# Patient Record
Sex: Female | Born: 1935 | Smoking: Never smoker
Health system: Southern US, Community
[De-identification: ages and names within clinical notes are randomized; demographics above are authoritative.]

---

## 1997-09-12 ENCOUNTER — Other Ambulatory Visit: Admission: RE | Admit: 1997-09-12 | Discharge: 1997-09-12 | Payer: Self-pay | Admitting: Gynecology

## 1998-10-14 ENCOUNTER — Other Ambulatory Visit: Admission: RE | Admit: 1998-10-14 | Discharge: 1998-10-14 | Payer: Self-pay | Admitting: Gynecology

## 1999-09-02 ENCOUNTER — Other Ambulatory Visit: Admission: RE | Admit: 1999-09-02 | Discharge: 1999-09-02 | Payer: Self-pay | Admitting: Gynecology

## 2000-11-09 ENCOUNTER — Other Ambulatory Visit: Admission: RE | Admit: 2000-11-09 | Discharge: 2000-11-09 | Payer: Self-pay | Admitting: Gynecology

## 2001-12-06 ENCOUNTER — Other Ambulatory Visit: Admission: RE | Admit: 2001-12-06 | Discharge: 2001-12-06 | Payer: Self-pay | Admitting: Gynecology

## 2002-01-24 ENCOUNTER — Ambulatory Visit (HOSPITAL_COMMUNITY): Admission: RE | Admit: 2002-01-24 | Discharge: 2002-01-24 | Payer: Self-pay | Admitting: Geriatric Medicine

## 2003-01-09 ENCOUNTER — Other Ambulatory Visit: Admission: RE | Admit: 2003-01-09 | Discharge: 2003-01-09 | Payer: Self-pay | Admitting: Gynecology

## 2004-01-21 ENCOUNTER — Other Ambulatory Visit: Admission: RE | Admit: 2004-01-21 | Discharge: 2004-01-21 | Payer: Self-pay | Admitting: Gynecology

## 2004-08-29 ENCOUNTER — Ambulatory Visit (HOSPITAL_COMMUNITY): Admission: RE | Admit: 2004-08-29 | Discharge: 2004-08-30 | Payer: Self-pay | Admitting: Ophthalmology

## 2005-01-22 ENCOUNTER — Other Ambulatory Visit: Admission: RE | Admit: 2005-01-22 | Discharge: 2005-01-22 | Payer: Self-pay | Admitting: Gynecology

## 2007-07-28 ENCOUNTER — Ambulatory Visit: Payer: Self-pay | Admitting: Oncology

## 2007-08-09 LAB — CBC WITH DIFFERENTIAL/PLATELET
HCT: 39.5 % (ref 34.8–46.6)
LYMPH%: 45.4 % (ref 14.0–48.0)
MCH: 27.3 pg (ref 26.0–34.0)
MCHC: 32.2 g/dL (ref 32.0–36.0)
MCV: 84.7 fL (ref 81.0–101.0)
MONO#: 0.4 10*3/uL (ref 0.1–0.9)
MONO%: 10.9 % (ref 0.0–13.0)
NEUT#: 1.5 10*3/uL (ref 1.5–6.5)
RBC: 4.66 10*6/uL (ref 3.70–5.32)
lymph#: 1.8 10*3/uL (ref 0.9–3.3)

## 2007-08-11 LAB — COMPREHENSIVE METABOLIC PANEL
Alkaline Phosphatase: 68 U/L (ref 39–117)
BUN: 11 mg/dL (ref 6–23)
CO2: 28 mEq/L (ref 19–32)
Chloride: 103 mEq/L (ref 96–112)
Creatinine, Ser: 1.03 mg/dL (ref 0.40–1.20)
Glucose, Bld: 86 mg/dL (ref 70–99)
Potassium: 3.9 mEq/L (ref 3.5–5.3)
Sodium: 143 mEq/L (ref 135–145)
Total Bilirubin: 0.5 mg/dL (ref 0.3–1.2)

## 2007-08-11 LAB — SPEP & IFE WITH QIG
Albumin ELP: 61.1 % (ref 55.8–66.1)
Alpha-1-Globulin: 3.8 % (ref 2.9–4.9)
Alpha-2-Globulin: 10.8 % (ref 7.1–11.8)
IgM, Serum: 94 mg/dL (ref 60–263)

## 2007-11-07 ENCOUNTER — Ambulatory Visit: Payer: Self-pay | Admitting: Oncology

## 2007-11-09 LAB — CBC WITH DIFFERENTIAL/PLATELET
BASO%: 0.1 % (ref 0.0–2.0)
Basophils Absolute: 0 10*3/uL (ref 0.0–0.1)
HCT: 36.6 % (ref 34.8–46.6)
HGB: 12.2 g/dL (ref 11.6–15.9)
MCV: 84 fL (ref 81.0–101.0)
MONO#: 0.6 10*3/uL (ref 0.1–0.9)
NEUT%: 53.1 % (ref 39.6–76.8)
RDW: 14.7 % — ABNORMAL HIGH (ref 11.3–14.5)
WBC: 4.8 10*3/uL (ref 3.9–10.0)

## 2008-05-03 ENCOUNTER — Ambulatory Visit: Payer: Self-pay | Admitting: Oncology

## 2008-05-08 LAB — CBC WITH DIFFERENTIAL/PLATELET
BASO%: 0.2 % (ref 0.0–2.0)
Basophils Absolute: 0 10*3/uL (ref 0.0–0.1)
EOS%: 2.9 % (ref 0.0–7.0)
HCT: 35.5 % (ref 34.8–46.6)
LYMPH%: 36.3 % (ref 14.0–49.7)
MCH: 27.1 pg (ref 25.1–34.0)
MCHC: 33.5 g/dL (ref 31.5–36.0)
MCV: 80.9 fL (ref 79.5–101.0)
MONO#: 0.6 10*3/uL (ref 0.1–0.9)
Platelets: 98 10*3/uL — ABNORMAL LOW (ref 145–400)
RBC: 4.39 10*6/uL (ref 3.70–5.45)
RDW: 14.7 % — ABNORMAL HIGH (ref 11.2–14.5)
lymph#: 1.6 10*3/uL (ref 0.9–3.3)
nRBC: 0 % (ref 0–0)

## 2008-05-11 ENCOUNTER — Ambulatory Visit (HOSPITAL_COMMUNITY): Admission: RE | Admit: 2008-05-11 | Discharge: 2008-05-11 | Payer: Self-pay | Admitting: Oncology

## 2008-10-05 ENCOUNTER — Ambulatory Visit: Payer: Self-pay | Admitting: Oncology

## 2008-10-10 LAB — CBC WITH DIFFERENTIAL/PLATELET
BASO%: 0.3 % (ref 0.0–2.0)
Basophils Absolute: 0 10*3/uL (ref 0.0–0.1)
HCT: 36.9 % (ref 34.8–46.6)
LYMPH%: 40.7 % (ref 14.0–49.7)
MCH: 27.9 pg (ref 25.1–34.0)
MCHC: 33.2 g/dL (ref 31.5–36.0)
MCV: 84 fL (ref 79.5–101.0)
MONO#: 0.4 10*3/uL (ref 0.1–0.9)
NEUT#: 1.6 10*3/uL (ref 1.5–6.5)
NEUT%: 45.6 % (ref 38.4–76.8)
Platelets: 114 10*3/uL — ABNORMAL LOW (ref 145–400)
RBC: 4.39 10*6/uL (ref 3.70–5.45)
RDW: 15.1 % — ABNORMAL HIGH (ref 11.2–14.5)
WBC: 3.5 10*3/uL — ABNORMAL LOW (ref 3.9–10.3)
lymph#: 1.4 10*3/uL (ref 0.9–3.3)

## 2008-10-10 LAB — COMPREHENSIVE METABOLIC PANEL
ALT: 15 U/L (ref 0–35)
Chloride: 104 mEq/L (ref 96–112)
Sodium: 143 mEq/L (ref 135–145)
Total Protein: 6.5 g/dL (ref 6.0–8.3)

## 2010-06-06 ENCOUNTER — Inpatient Hospital Stay (INDEPENDENT_AMBULATORY_CARE_PROVIDER_SITE_OTHER)
Admission: RE | Admit: 2010-06-06 | Discharge: 2010-06-06 | Disposition: A | Discharge status: Home / Self Care | Payer: Self-pay | Source: Ambulatory Visit | Attending: Emergency Medicine | Admitting: Emergency Medicine

## 2010-06-06 DIAGNOSIS — Z043 Encounter for examination and observation following other accident: Secondary | ICD-10-CM

## 2010-06-20 NOTE — Op Note (Signed)
NAME:  Kristina Brooks, Kristina Brooks                 ACCOUNT NO.:  [id number]   MEDICAL RECORD NO.:  [id number]          PATIENT TYPE:  OIB   LOCATION:  5735                         FACILITY:  [facility]   PHYSICIAN:  Alford Highland. Rankin, M.D.   DATE OF BIRTH:  1934-06-23   DATE OF PROCEDURE:  08/29/2004  DATE OF DISCHARGE:                                 OPERATIVE REPORT   PREOPERATIVE DIAGNOSIS:  Rhegmatogenous detachment - left eye - temporally.   POSTOPERATIVE DIAGNOSIS:  Rhegmatogenous detachment - left eye - temporally.   PROCEDURES:  1.  Scleral buckle using 287 solid silicone explant temporally and      inferotemporally.  2.  Retinal cryopexy left eye to repair retinal detachment.   SURGEON:  Fawn Kirk, M.D.   ANESTHESIA:  Local retrobulbar with monitored anesthesia control.   INDICATION FOR PROCEDURE:  The patient is a 76 year old woman who has  subclinical rhegmatogenous detachment with threatened extension.  She  understands this is an attempt to repair the retinal detachment as well as  to prevent progression of retinal detachment to involve central visual  acuity.  She understands the risks of anesthesia including rare occurrence  of death, problems to the eye from the procedure and the surgical repair  including, but not limited to, hemorrhage, infection, scarring, need for  surgery, no change in vision, loss of vision, progressive disease despite  intervention.  Appropriate signed consent was obtained.  The patient was  taken to the operating room.  In the operating room appropriate monitors  followed by mild sedation.  Marcaine 0.75% was delivered in 5 mL followed by  an additional 5 mL laterally in the fashion of a modified [employer]  The  left periocular region was sterilely prepped and draped in the usual  ophthalmic fashion, with a lid speculum applied.  Peritomy was then  fashioned for the 2:30 to the 6:30 position.  Intermuscular septum was  entered superotemporal, inferotemporal  and inferonasal quadrants.  The  inferior and lateral rectus muscles were isolated on 2-0 silk ties.  Retinal  cryopexy was placed in the bed of the retinal detachment on the  inferotemporal quadrant.  A retinal tear was discovered and was well treated  at the 5 o'clock position.   At this time, a 287 solid silicone explant was selected and tied temporarily  inferotemporal quadrant.  The ends were placed into each of the rectus  muscles mentioned above.  A 30 gauge needle was then used to perform  paracentesis so as to soften the globe.  Buccal was then permanently tied  with extra height.   At this time, an indirect ophthalmoscopy was then performed to confirm that  buccal had supported the bed of the detachment nicely.  There was  essentially no subretinal fluid left.  At this time, conjunctiva then tenons  were brought forward and closed with interrupted 7-0 Vicryl suture.  The bed  of the buckle had been irrigated with bug juice.  Subconjunctival injections  of antibiotics were applied.   At this time, sterile patch and Fox shield applied left eye.  Patient  tolerated the procedure without complication.  She was taken to the PACU in  good and stable condition.      Alford Highland Rankin, M.D.  Electronically Signed     GAR/MEDQ  D:  08/29/2004  T:  08/29/2004  Job:  [id number]

## 2014-04-06 DIAGNOSIS — H25013 Cortical age-related cataract, bilateral: Secondary | ICD-10-CM | POA: Diagnosis not present

## 2014-04-06 DIAGNOSIS — H33199 Other retinoschisis and retinal cysts, unspecified eye: Secondary | ICD-10-CM | POA: Diagnosis not present

## 2014-04-06 DIAGNOSIS — H2513 Age-related nuclear cataract, bilateral: Secondary | ICD-10-CM | POA: Diagnosis not present

## 2014-04-06 DIAGNOSIS — H35361 Drusen (degenerative) of macula, right eye: Secondary | ICD-10-CM | POA: Diagnosis not present

## 2014-07-24 DIAGNOSIS — Z1231 Encounter for screening mammogram for malignant neoplasm of breast: Secondary | ICD-10-CM | POA: Diagnosis not present

## 2014-08-15 DIAGNOSIS — L932 Other local lupus erythematosus: Secondary | ICD-10-CM | POA: Diagnosis not present

## 2014-08-15 DIAGNOSIS — R5383 Other fatigue: Secondary | ICD-10-CM | POA: Diagnosis not present

## 2014-08-15 DIAGNOSIS — R7989 Other specified abnormal findings of blood chemistry: Secondary | ICD-10-CM | POA: Diagnosis not present

## 2014-08-15 DIAGNOSIS — E784 Other hyperlipidemia: Secondary | ICD-10-CM | POA: Diagnosis not present

## 2014-08-15 DIAGNOSIS — E559 Vitamin D deficiency, unspecified: Secondary | ICD-10-CM | POA: Diagnosis not present

## 2014-08-19 DIAGNOSIS — Z9889 Other specified postprocedural states: Secondary | ICD-10-CM | POA: Diagnosis not present

## 2014-08-19 DIAGNOSIS — Z6821 Body mass index (BMI) 21.0-21.9, adult: Secondary | ICD-10-CM | POA: Diagnosis not present

## 2014-08-19 DIAGNOSIS — Z8249 Family history of ischemic heart disease and other diseases of the circulatory system: Secondary | ICD-10-CM | POA: Diagnosis not present

## 2014-08-19 DIAGNOSIS — H269 Unspecified cataract: Secondary | ICD-10-CM | POA: Diagnosis not present

## 2014-08-19 DIAGNOSIS — D649 Anemia, unspecified: Secondary | ICD-10-CM | POA: Diagnosis not present

## 2014-08-19 DIAGNOSIS — E785 Hyperlipidemia, unspecified: Secondary | ICD-10-CM | POA: Diagnosis not present

## 2014-08-19 DIAGNOSIS — J302 Other seasonal allergic rhinitis: Secondary | ICD-10-CM | POA: Diagnosis not present

## 2014-08-28 DIAGNOSIS — L932 Other local lupus erythematosus: Secondary | ICD-10-CM | POA: Diagnosis not present

## 2014-10-11 DIAGNOSIS — J301 Allergic rhinitis due to pollen: Secondary | ICD-10-CM | POA: Diagnosis not present

## 2014-10-11 DIAGNOSIS — R21 Rash and other nonspecific skin eruption: Secondary | ICD-10-CM | POA: Diagnosis not present

## 2014-10-11 DIAGNOSIS — J3089 Other allergic rhinitis: Secondary | ICD-10-CM | POA: Diagnosis not present

## 2014-10-11 DIAGNOSIS — J3081 Allergic rhinitis due to animal (cat) (dog) hair and dander: Secondary | ICD-10-CM | POA: Diagnosis not present

## 2014-12-05 DIAGNOSIS — E559 Vitamin D deficiency, unspecified: Secondary | ICD-10-CM | POA: Diagnosis not present

## 2014-12-05 DIAGNOSIS — L932 Other local lupus erythematosus: Secondary | ICD-10-CM | POA: Diagnosis not present

## 2014-12-05 DIAGNOSIS — R5383 Other fatigue: Secondary | ICD-10-CM | POA: Diagnosis not present

## 2014-12-05 DIAGNOSIS — R7989 Other specified abnormal findings of blood chemistry: Secondary | ICD-10-CM | POA: Diagnosis not present

## 2014-12-05 DIAGNOSIS — E784 Other hyperlipidemia: Secondary | ICD-10-CM | POA: Diagnosis not present

## 2015-01-01 DIAGNOSIS — L932 Other local lupus erythematosus: Secondary | ICD-10-CM | POA: Diagnosis not present

## 2015-03-06 ENCOUNTER — Ambulatory Visit: Payer: Self-pay | Admitting: Podiatry

## 2015-03-06 ENCOUNTER — Ambulatory Visit (INDEPENDENT_AMBULATORY_CARE_PROVIDER_SITE_OTHER): Payer: Medicare Other | Admitting: Podiatry

## 2015-03-06 ENCOUNTER — Encounter: Payer: Self-pay | Admitting: Podiatry

## 2015-03-06 VITALS — BP 154/43 | HR 55

## 2015-03-06 DIAGNOSIS — M79606 Pain in leg, unspecified: Secondary | ICD-10-CM | POA: Insufficient documentation

## 2015-03-06 DIAGNOSIS — M79673 Pain in unspecified foot: Secondary | ICD-10-CM | POA: Diagnosis not present

## 2015-03-06 DIAGNOSIS — B351 Tinea unguium: Secondary | ICD-10-CM | POA: Diagnosis not present

## 2015-03-06 DIAGNOSIS — Q828 Other specified congenital malformations of skin: Secondary | ICD-10-CM | POA: Diagnosis not present

## 2015-03-06 NOTE — Patient Instructions (Signed)
Seen for painful calluses. All debrided and padded on shoe liner.  Return as needed.

## 2015-03-06 NOTE — Progress Notes (Signed)
SUBJECTIVE: 81 y.o. year old female presents with painful calluses under the balls of both feet. Stated that she has had foot surgery on left to relieve pressure under ball of left foot. Still having callus problem.  REVIEW OF SYSTEMS: A comprehensive review of systems was negative.  OBJECTIVE: DERMATOLOGIC EXAMINATION: Nails: Thick hypertrophic nails x 10. Plantar callus under 2nd and 3rd MPJ area bilateral. Old incision mark over bunion and 2nd ray left. VASCULAR EXAMINATION OF LOWER LIMBS: Pedal pulses: All pedal pulses are palpable with normal pulsation.  Capillary Filling times within 3 seconds in all digits.  No edema, no ischemic changes noted. Temperature gradient from tibial crest to dorsum of foot is within normal bilateral.  NEUROLOGIC EXAMINATION OF THE LOWER LIMBS: All epicritic and tactile sensations grossly intact.   MUSCULOSKELETAL EXAMINATION: Positive for prominent metatarsal heads with thinned out fat pad under balls of both feet.  No gross deformities.  ASSESSMENT: 1. Onychomycosis x 10. 2. Plantar keratoma bilateral. 3. Pain in lower limbs.   PLAN: Reviewed clinical findings and available treatment options. All nails and calluses debrided. Placed metatarsal pad on shoe insole to relieve pressure to metatarsal heads. Return as needed.

## 2016-07-08 ENCOUNTER — Ambulatory Visit (INDEPENDENT_AMBULATORY_CARE_PROVIDER_SITE_OTHER): Payer: Medicare Other | Admitting: Podiatry

## 2016-07-08 ENCOUNTER — Encounter: Payer: Self-pay | Admitting: Podiatry

## 2016-07-08 VITALS — BP 146/58 | HR 60

## 2016-07-08 DIAGNOSIS — Q828 Other specified congenital malformations of skin: Secondary | ICD-10-CM

## 2016-07-08 DIAGNOSIS — M79606 Pain in leg, unspecified: Secondary | ICD-10-CM | POA: Diagnosis not present

## 2016-07-08 DIAGNOSIS — B351 Tinea unguium: Secondary | ICD-10-CM | POA: Diagnosis not present

## 2016-07-08 NOTE — Patient Instructions (Signed)
Seen for painful calluses on both feet. Noted of callus under both feet, using old orthotics and ingrown great toe nails. All calluses and ingrown nails debrided. Continue using old orthotics. Return in 3 months for re evaluation.

## 2016-07-08 NOTE — Progress Notes (Signed)
SUBJECTIVE: 82 y.o. year old female presents with painful calluses under the balls of both feet. Still active with daily exercise.  HPI: Bunion surgery and 2nd metatarsal osteotomy left to relieve pressure under ball of left foot.   OBJECTIVE: DERMATOLOGIC EXAMINATION: Ingrown nails on both great toes. Plantar callus under 2nd and 3rd MPJ area bilateral. Old incision mark over bunion and 2nd ray left. VASCULAR EXAMINATION OF LOWER LIMBS: Pedal pulses: All pedal pulses are palpable with normal pulsation.  Capillary Filling times within 3 seconds in all digits.  No edema, no ischemic changes noted. Temperature gradient from tibial crest to dorsum of foot is within normal bilateral.  NEUROLOGIC EXAMINATION OF THE LOWER LIMBS: All epicritic and tactile sensations grossly intact.   MUSCULOSKELETAL EXAMINATION: Positive for prominent metatarsal heads with thinned out fat pad under balls of both feet.  No gross deformities.  ASSESSMENT: 1. Onychocryptosis both great toe. 2. Plantar keratoma bilateral. 3. Pain in lower limbs.   PLAN: Reviewed clinical findings and available treatment options. All nails and calluses debrided. Patient is to come in 3 month for palliation. If indicated may need a new pair orthotics.

## 2016-10-07 ENCOUNTER — Ambulatory Visit (INDEPENDENT_AMBULATORY_CARE_PROVIDER_SITE_OTHER): Payer: Medicare Other | Admitting: Podiatry

## 2016-10-07 DIAGNOSIS — M21969 Unspecified acquired deformity of unspecified lower leg: Secondary | ICD-10-CM

## 2016-10-07 DIAGNOSIS — Q828 Other specified congenital malformations of skin: Secondary | ICD-10-CM

## 2016-10-07 DIAGNOSIS — M79606 Pain in leg, unspecified: Secondary | ICD-10-CM | POA: Diagnosis not present

## 2016-10-07 NOTE — Patient Instructions (Signed)
Seen for hypertrophic calluses and nails. All nails and calluses debrided. Return in 3 months or as needed.

## 2016-10-08 ENCOUNTER — Encounter: Payer: Self-pay | Admitting: Podiatry

## 2016-10-08 NOTE — Progress Notes (Signed)
SUBJECTIVE: 82 y.o.year old femalepresents with painful calluses under the balls of both feet. Still active with daily exercise. Patient brought in a couple of new pair shoes and old orthotics.  HPI: Bunion surgery and 2nd metatarsal osteotomy left to relieve pressure under ball of left foot.   OBJECTIVE: DERMATOLOGIC EXAMINATION: Plantar callus under 2nd and 3rd MPJ area bilateral. Old incision mark over bunion and 2nd ray left. VASCULAR EXAMINATION OF LOWER LIMBS: Pedal pulses: All pedal pulses are palpable with normal pulsation.  Capillary Filling times within 3 seconds in all digits.  No edema, no ischemic changes noted. Temperature gradient from tibial crest to dorsum of foot is within normal bilateral. NEUROLOGIC EXAMINATION OF THE LOWER LIMBS: All epicritic and tactile sensations grossly intact.  MUSCULOSKELETAL EXAMINATION: Positive for prominent metatarsal heads with thinned out fat pad under balls of both feet.  No gross deformities.  ASSESSMENT: 1. Plantar keratoma bilateral. 2. Pain in lower limbs.  3. Loss of plantar fat pad. 4. Plantar flexed lesser metatarsal bone.  PLAN: Reviewed clinical findings and available treatment options. All calluses debrided. Continue with existing orthotics. Return as needed.

## 2017-01-06 ENCOUNTER — Encounter: Payer: Self-pay | Admitting: Podiatry

## 2017-01-06 ENCOUNTER — Ambulatory Visit: Payer: Medicare Other | Admitting: Podiatry

## 2017-01-06 DIAGNOSIS — M79606 Pain in leg, unspecified: Secondary | ICD-10-CM

## 2017-01-06 DIAGNOSIS — B351 Tinea unguium: Secondary | ICD-10-CM

## 2017-01-06 DIAGNOSIS — M21969 Unspecified acquired deformity of unspecified lower leg: Secondary | ICD-10-CM

## 2017-01-06 DIAGNOSIS — Q828 Other specified congenital malformations of skin: Secondary | ICD-10-CM | POA: Diagnosis not present

## 2017-01-06 NOTE — Progress Notes (Signed)
Subjective: 82 y.o. year old female patient presents complaining of painful nails. Patient requests toe nails, corns and calluses trimmed.  Still active in her regular exercise.  HPI: Bunion surgery and 2nd metatarsal osteotomy left to relievepressure under ball of left foot.   Objective: Dermatologic: Thick yellow deformed nails both great toes. Plantar painful calluses sub 2-3 bilateral. Vascular: Pedal pulses are all palpable. Orthopedic: Thin plantar fat pad with prominent lesser metatarsal heads bilateral. Neurologic: All epicritic and tactile sensations grossly intact.  Assessment: Dystrophic mycotic nails x 10. Painful plantar keratosis bilateral.  Treatment: All mycotic nails, corns, calluses debrided.  Return in 3 months or as needed.

## 2017-01-06 NOTE — Patient Instructions (Signed)
Seen for hypertrophic nails and painful calluses. All nails and calluses debrided. Return in 3 months or as needed.  

## 2017-04-06 ENCOUNTER — Ambulatory Visit: Payer: Medicare Other | Admitting: Podiatry

## 2017-04-06 DIAGNOSIS — M79671 Pain in right foot: Secondary | ICD-10-CM

## 2017-04-06 DIAGNOSIS — M79672 Pain in left foot: Secondary | ICD-10-CM

## 2017-04-06 DIAGNOSIS — B351 Tinea unguium: Secondary | ICD-10-CM | POA: Diagnosis not present

## 2017-04-06 NOTE — Patient Instructions (Signed)
Seen for hypertrophic nails and painful calluses. All nails and calluses debrided. Return in 3 months or as needed.  

## 2017-04-07 ENCOUNTER — Encounter: Payer: Self-pay | Admitting: Podiatry

## 2017-04-07 NOTE — Progress Notes (Signed)
Subjective: 83 y.o. year old female patient presents complaining of painful nails and calluses.  Patient requests toe nails, corns and calluses trimmed.    HPI: Bunion surgery and 2nd metatarsal osteotomy left to relievepressure under ball of left foot.   Objective: Dermatologic: Thick yellow deformed nails x 10. Plantar calluses under 2nd and 3rd  MPJ area bilateral. Vascular: Pedal pulses are all palpable. Orthopedic: Thin plantar fat pad with prominent lesser metatarsal head bilateral. Neurologic: All epicritic and tactile sensations grossly intact.  Assessment: Dystrophic mycotic nails x 10. Plantar porokeratosis bilateral. Pain in both feet.  Treatment: All mycotic nails, corns, calluses debrided.  Return in 3 months or as needed.

## 2017-07-08 ENCOUNTER — Encounter: Payer: Self-pay | Admitting: Podiatry

## 2017-07-08 ENCOUNTER — Ambulatory Visit: Payer: Medicare Other | Admitting: Podiatry

## 2017-07-08 DIAGNOSIS — M79671 Pain in right foot: Secondary | ICD-10-CM | POA: Diagnosis not present

## 2017-07-08 DIAGNOSIS — M79672 Pain in left foot: Secondary | ICD-10-CM | POA: Diagnosis not present

## 2017-07-08 DIAGNOSIS — B351 Tinea unguium: Secondary | ICD-10-CM

## 2017-07-08 NOTE — Patient Instructions (Signed)
Seen for hypertrophic nails and painful claluses. All nails and calluses debrided. Return in 3 months or as needed.

## 2017-07-08 NOTE — Progress Notes (Signed)
  Subjective: 83 y.o. year old female patient presents complaining of painful nails. Patient requests toe nails, corns and calluses trimmed.  Does exercise daily.  Objective: Dermatologic: Thick yellow deformed nails x 10. Plantar calluses under ball of both feet. Vascular: Pedal pulses are all palpable. Orthopedic: Contracted lesser digits bilateral. Neurologic: All epicritic and tactile sensations grossly intact.  Assessment: Dystrophic mycotic nails x 10.  Treatment: All mycotic nails, corns, calluses debrided.  Return in 3 months or as needed.

## 2017-10-12 ENCOUNTER — Ambulatory Visit: Payer: Medicare Other | Admitting: Podiatry

## 2017-10-12 DIAGNOSIS — M79671 Pain in right foot: Secondary | ICD-10-CM

## 2017-10-12 DIAGNOSIS — M79672 Pain in left foot: Secondary | ICD-10-CM

## 2017-10-12 DIAGNOSIS — Q828 Other specified congenital malformations of skin: Secondary | ICD-10-CM

## 2017-10-12 DIAGNOSIS — M21969 Unspecified acquired deformity of unspecified lower leg: Secondary | ICD-10-CM

## 2017-10-12 NOTE — Patient Instructions (Addendum)
Seen for painful calluses. All lesions debrided. Return in 3 months or as needed.

## 2017-10-13 ENCOUNTER — Encounter: Payer: Self-pay | Admitting: Podiatry

## 2017-10-13 NOTE — Progress Notes (Signed)
Subjective: 83 y.o. year old female patient presents complaining of painful calluses on both feet.  Objective: Dermatologic: Painful plantar calluses under 2nd MPJ area bilateral. Vascular: Pedal pulses are all palpable. Orthopedic: Loss of plantar fat pad bilateral. Plantar flexed 2nd metatarsal bilateral. Neurologic: All epicritic and tactile sensations grossly intact.  Assessment: Painful calluses plantar bilateral.  Treatment: All painful calluses debrided.  Return in 3 months or as needed.

## 2018-01-11 ENCOUNTER — Ambulatory Visit: Payer: Medicare Other | Admitting: Podiatry

## 2019-08-11 DIAGNOSIS — M76821 Posterior tibial tendinitis, right leg: Secondary | ICD-10-CM | POA: Diagnosis not present

## 2019-08-15 ENCOUNTER — Ambulatory Visit (INDEPENDENT_AMBULATORY_CARE_PROVIDER_SITE_OTHER): Payer: Medicare PPO | Admitting: Ophthalmology

## 2019-08-15 ENCOUNTER — Other Ambulatory Visit: Payer: Self-pay

## 2019-08-15 ENCOUNTER — Encounter (INDEPENDENT_AMBULATORY_CARE_PROVIDER_SITE_OTHER): Payer: Self-pay | Admitting: Ophthalmology

## 2019-08-15 DIAGNOSIS — H353131 Nonexudative age-related macular degeneration, bilateral, early dry stage: Secondary | ICD-10-CM | POA: Diagnosis not present

## 2019-08-15 DIAGNOSIS — Z8669 Personal history of other diseases of the nervous system and sense organs: Secondary | ICD-10-CM | POA: Insufficient documentation

## 2019-08-15 DIAGNOSIS — H33101 Unspecified retinoschisis, right eye: Secondary | ICD-10-CM | POA: Diagnosis not present

## 2019-08-15 DIAGNOSIS — H2513 Age-related nuclear cataract, bilateral: Secondary | ICD-10-CM | POA: Diagnosis not present

## 2019-08-15 DIAGNOSIS — H33199 Other retinoschisis and retinal cysts, unspecified eye: Secondary | ICD-10-CM | POA: Insufficient documentation

## 2019-08-15 HISTORY — DX: Age-related nuclear cataract, bilateral: H25.13

## 2019-08-15 NOTE — Assessment & Plan Note (Signed)
Agree with Dr. Philmore Pali that patient has visually significant cataract in each eye.  She has no peripheral retinal high risk features to preclude procession to cataract surgery with intraocular lens placements.  I will asked the patient to return to Dr. Camillo Flaming who will coordinate with a cataract surgeon of his and the patient's choice

## 2019-08-15 NOTE — Progress Notes (Signed)
08/15/2019     CHIEF COMPLAINT Patient presents for Retina Evaluation   HISTORY OF PRESENT ILLNESS: Kristina Brooks is a 85 y.o. female who presents to the clinic today for:   HPI    Retina Evaluation    In both eyes.  This started 2 weeks ago.  Duration of 2 weeks.  Associated Symptoms Floaters.  Negative for Flashes and Pain.  Context:  reading.          Comments    2 year f/u and Retina evaluation per Dr.Koop prior to cataract surgery with dilated exam and OCT(MAC) today.  Pt states she has floaters in both eyes and they appear to be worse at night when she is trying to read.        Last edited by Melburn Popper, COA on 08/15/2019  2:31 PM. (History)      Referring physician: Lajean Manes, MD [address],  [place] [address]  HISTORICAL INFORMATION:   Selected notes from the [facility]: No current outpatient medications on file. (Ophthalmic Drugs)   No current facility-administered medications for this visit. (Ophthalmic Drugs)   Current Outpatient Medications (Other)  Medication Sig  . azelastine (ASTELIN) 0.1 % nasal spray Place into both nostrils 2 (two) times daily. Use in each nostril as directed  . fluticasone (FLONASE) 50 MCG/ACT nasal spray Place into both nostrils daily.  Marland Kitchen triamcinolone cream (KENALOG) 0.1 % Apply 1 application topically 2 (two) times daily.   No current facility-administered medications for this visit. (Other)      REVIEW OF SYSTEMS:    ALLERGIES Allergies  Allergen Reactions  . Penicillins     PAST MEDICAL HISTORY History reviewed. No pertinent past medical history. History reviewed. No pertinent surgical history.  FAMILY HISTORY History reviewed. No pertinent family history.  SOCIAL HISTORY Social History   Tobacco Use  . Smoking status: Never Smoker  . Smokeless tobacco: Never Used  Substance Use Topics  . Alcohol use: Not on file  . Drug  use: Not on file         OPHTHALMIC EXAM:  Base Eye Exam    Visual Acuity (ETDRS)      Right Left   Dist cc 20/40 20/40 -1   Dist ph cc 20/30 20/30 -1   Correction: Glasses       Tonometry (Tonopen, 2:30 PM)      Right Left   Pressure 10 8       Pupils      Pupils Dark Light Shape React APD   Right PERRL 3 2 Round Slow None   Left PERRL 3 2 Round Slow None       Visual Fields (Counting fingers)      Left Right    Full Full       Extraocular Movement      Right Left    Full Full       Neuro/Psych    Oriented x3: Yes   Mood/Affect: Normal       Dilation    Both eyes: 1.0% Mydriacyl, 2.5% Phenylephrine @ 2:30 PM        Slit Lamp and Fundus Exam    External Exam      Right Left   External Normal Normal       Slit Lamp Exam      Right Left   Lids/Lashes Normal Normal   Conjunctiva/Sclera White  and quiet White and quiet   Cornea Clear Clear   Anterior Chamber Deep and quiet Deep and quiet   Iris Round and reactive Round and reactive   Lens 3+ Nuclear sclerosis, 3+ Cortical cataract 3+ Nuclear sclerosis, 3+ Cortical cataract   Anterior Vitreous Normal Normal       Fundus Exam      Right Left   Posterior Vitreous Normal Normal   Disc Normal Normal   C/D Ratio 0.45 0.45   Macula Normal Normal   Vessels Normal Normal   Periphery Retinal schisis inferotemporal, no retinal detachment Good buckle, attached          IMAGING AND PROCEDURES  Imaging and Procedures for 08/15/19  OCT, Retina - OU - Both Eyes       Right Eye Quality was borderline. Scan locations included subfoveal. Progression has been stable. Findings include normal foveal contour.   Left Eye Quality was borderline. Scan locations included subfoveal. Progression has been stable. Findings include normal foveal contour.   Notes Normal foveal contour OU with very dense medial opacity corresponding with dense cataract                ASSESSMENT/PLAN:  Nuclear sclerotic  cataract of both eyes Agree with Dr. Philmore Pali that patient has visually significant cataract in each eye.  She has no peripheral retinal high risk features to preclude procession to cataract surgery with intraocular lens placements.  I will asked the patient to return to Dr. Camillo Flaming who will coordinate with a cataract surgeon of his and the patient's choice      ICD-10-CM   1. 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 Early stage nonexudative age-related macular degeneration of both eyes  H35.3131   3. Nuclear sclerotic cataract of both eyes  H25.13   4. History of retinal detachment  Z86.69     1.  Follow-up Dr. Philmore Pali for planning of cataract extraction with intraocular lens placement in each eye  2.  3.  Ophthalmic Meds Ordered this visit:  No orders of the defined types were placed in this encounter.      Return in about 2 years (around 08/14/2021) for DILATE OU, COLOR FP.  There are no Patient Instructions on file for this visit.   Explained the diagnoses, plan, and follow up with the patient and they expressed understanding.  Patient expressed understanding of the importance of proper follow up care.   Clent Demark Breniya Goertzen M.D. Diseases & Surgery of the Retina and Vitreous [facility] [place] 08/15/19     Abbreviations: M myopia (nearsighted); A astigmatism; H hyperopia (farsighted); P presbyopia; Mrx spectacle prescription;  CTL contact lenses; OD right eye; OS left eye; OU both eyes  XT exotropia; ET esotropia; PEK punctate epithelial keratitis; PEE punctate epithelial erosions; DES dry eye syndrome; MGD meibomian gland dysfunction; ATs artificial tears; PFAT's preservative free artificial tears; [place] nuclear sclerotic cataract; PSC posterior subcapsular cataract; ERM epi-retinal membrane; PVD posterior vitreous detachment; RD retinal detachment; DM diabetes mellitus; DR diabetic retinopathy; NPDR non-proliferative diabetic  retinopathy; PDR proliferative diabetic retinopathy; CSME clinically significant macular edema; DME diabetic macular edema; dbh dot blot hemorrhages; CWS cotton wool spot; POAG primary open angle glaucoma; C/D cup-to-disc ratio; HVF humphrey visual field; GVF goldmann visual field; OCT optical coherence tomography; IOP intraocular pressure; BRVO Branch retinal vein occlusion; CRVO central retinal vein occlusion; CRAO central retinal artery occlusion; BRAO branch retinal artery occlusion; RT  retinal tear; SB scleral buckle; PPV pars plana vitrectomy; VH Vitreous hemorrhage; PRP panretinal laser photocoagulation; IVK intravitreal kenalog; VMT vitreomacular traction; MH Macular hole;  NVD neovascularization of the disc; NVE neovascularization elsewhere; AREDS age related eye disease study; ARMD age related macular degeneration; POAG primary open angle glaucoma; EBMD epithelial/anterior basement membrane dystrophy; ACIOL anterior chamber intraocular lens; IOL intraocular lens; PCIOL posterior chamber intraocular lens; Phaco/IOL phacoemulsification with intraocular lens placement; [place] photorefractive keratectomy; LASIK laser assisted in situ keratomileusis; HTN hypertension; DM diabetes mellitus; COPD chronic obstructive pulmonary disease

## 2019-08-17 DIAGNOSIS — M25572 Pain in left ankle and joints of left foot: Secondary | ICD-10-CM | POA: Diagnosis not present

## 2019-08-17 DIAGNOSIS — L932 Other local lupus erythematosus: Secondary | ICD-10-CM | POA: Diagnosis not present

## 2019-08-31 DIAGNOSIS — B351 Tinea unguium: Secondary | ICD-10-CM | POA: Diagnosis not present

## 2019-08-31 DIAGNOSIS — L84 Corns and callosities: Secondary | ICD-10-CM | POA: Diagnosis not present

## 2019-08-31 DIAGNOSIS — M79671 Pain in right foot: Secondary | ICD-10-CM | POA: Diagnosis not present

## 2019-08-31 DIAGNOSIS — M79672 Pain in left foot: Secondary | ICD-10-CM | POA: Diagnosis not present

## 2019-09-05 DIAGNOSIS — N951 Menopausal and female climacteric states: Secondary | ICD-10-CM | POA: Diagnosis not present

## 2019-09-05 DIAGNOSIS — E7849 Other hyperlipidemia: Secondary | ICD-10-CM | POA: Diagnosis not present

## 2019-09-05 DIAGNOSIS — R5383 Other fatigue: Secondary | ICD-10-CM | POA: Diagnosis not present

## 2019-09-05 DIAGNOSIS — R7989 Other specified abnormal findings of blood chemistry: Secondary | ICD-10-CM | POA: Diagnosis not present

## 2019-09-05 DIAGNOSIS — E559 Vitamin D deficiency, unspecified: Secondary | ICD-10-CM | POA: Diagnosis not present

## 2019-09-12 DIAGNOSIS — M25572 Pain in left ankle and joints of left foot: Secondary | ICD-10-CM | POA: Diagnosis not present

## 2019-09-12 DIAGNOSIS — L932 Other local lupus erythematosus: Secondary | ICD-10-CM | POA: Diagnosis not present

## 2019-09-26 DIAGNOSIS — Z1231 Encounter for screening mammogram for malignant neoplasm of breast: Secondary | ICD-10-CM | POA: Diagnosis not present

## 2019-09-26 DIAGNOSIS — Z681 Body mass index (BMI) 19 or less, adult: Secondary | ICD-10-CM | POA: Diagnosis not present

## 2019-09-26 DIAGNOSIS — Z01419 Encounter for gynecological examination (general) (routine) without abnormal findings: Secondary | ICD-10-CM | POA: Diagnosis not present

## 2019-09-27 DIAGNOSIS — L03119 Cellulitis of unspecified part of limb: Secondary | ICD-10-CM | POA: Diagnosis not present

## 2019-09-27 DIAGNOSIS — L02619 Cutaneous abscess of unspecified foot: Secondary | ICD-10-CM | POA: Diagnosis not present

## 2019-09-27 DIAGNOSIS — M25551 Pain in right hip: Secondary | ICD-10-CM | POA: Diagnosis not present

## 2019-09-27 DIAGNOSIS — M79671 Pain in right foot: Secondary | ICD-10-CM | POA: Diagnosis not present

## 2019-10-10 DIAGNOSIS — H2512 Age-related nuclear cataract, left eye: Secondary | ICD-10-CM | POA: Diagnosis not present

## 2019-10-10 DIAGNOSIS — H25043 Posterior subcapsular polar age-related cataract, bilateral: Secondary | ICD-10-CM | POA: Diagnosis not present

## 2019-10-10 DIAGNOSIS — H25013 Cortical age-related cataract, bilateral: Secondary | ICD-10-CM | POA: Diagnosis not present

## 2019-10-10 DIAGNOSIS — H18413 Arcus senilis, bilateral: Secondary | ICD-10-CM | POA: Diagnosis not present

## 2019-10-10 DIAGNOSIS — H2513 Age-related nuclear cataract, bilateral: Secondary | ICD-10-CM | POA: Diagnosis not present

## 2019-10-20 DIAGNOSIS — J3089 Other allergic rhinitis: Secondary | ICD-10-CM | POA: Diagnosis not present

## 2019-10-20 DIAGNOSIS — J3081 Allergic rhinitis due to animal (cat) (dog) hair and dander: Secondary | ICD-10-CM | POA: Diagnosis not present

## 2019-10-20 DIAGNOSIS — R21 Rash and other nonspecific skin eruption: Secondary | ICD-10-CM | POA: Diagnosis not present

## 2019-10-20 DIAGNOSIS — J301 Allergic rhinitis due to pollen: Secondary | ICD-10-CM | POA: Diagnosis not present

## 2019-11-02 DIAGNOSIS — L84 Corns and callosities: Secondary | ICD-10-CM | POA: Diagnosis not present

## 2019-11-02 DIAGNOSIS — B351 Tinea unguium: Secondary | ICD-10-CM | POA: Diagnosis not present

## 2019-11-02 DIAGNOSIS — M79672 Pain in left foot: Secondary | ICD-10-CM | POA: Diagnosis not present

## 2019-11-02 DIAGNOSIS — M79671 Pain in right foot: Secondary | ICD-10-CM | POA: Diagnosis not present

## 2019-11-03 DIAGNOSIS — H25041 Posterior subcapsular polar age-related cataract, right eye: Secondary | ICD-10-CM | POA: Diagnosis not present

## 2019-11-03 DIAGNOSIS — H25011 Cortical age-related cataract, right eye: Secondary | ICD-10-CM | POA: Diagnosis not present

## 2019-11-03 DIAGNOSIS — H2511 Age-related nuclear cataract, right eye: Secondary | ICD-10-CM | POA: Diagnosis not present

## 2019-11-03 DIAGNOSIS — H2512 Age-related nuclear cataract, left eye: Secondary | ICD-10-CM | POA: Diagnosis not present

## 2019-11-10 DIAGNOSIS — H25013 Cortical age-related cataract, bilateral: Secondary | ICD-10-CM | POA: Diagnosis not present

## 2019-11-17 DIAGNOSIS — H93293 Other abnormal auditory perceptions, bilateral: Secondary | ICD-10-CM | POA: Diagnosis not present

## 2019-11-30 DIAGNOSIS — H25011 Cortical age-related cataract, right eye: Secondary | ICD-10-CM | POA: Diagnosis not present

## 2019-12-01 DIAGNOSIS — H2511 Age-related nuclear cataract, right eye: Secondary | ICD-10-CM | POA: Diagnosis not present

## 2019-12-06 ENCOUNTER — Ambulatory Visit
Admission: RE | Admit: 2019-12-06 | Discharge: 2019-12-06 | Disposition: A | Discharge status: Home / Self Care | Payer: Medicare PPO | Source: Ambulatory Visit | Attending: Geriatric Medicine | Admitting: Geriatric Medicine

## 2019-12-06 ENCOUNTER — Other Ambulatory Visit: Payer: Self-pay | Admitting: Geriatric Medicine

## 2019-12-06 DIAGNOSIS — I10 Essential (primary) hypertension: Secondary | ICD-10-CM | POA: Diagnosis not present

## 2019-12-06 DIAGNOSIS — M25551 Pain in right hip: Secondary | ICD-10-CM

## 2019-12-12 DIAGNOSIS — R7989 Other specified abnormal findings of blood chemistry: Secondary | ICD-10-CM | POA: Diagnosis not present

## 2019-12-12 DIAGNOSIS — E559 Vitamin D deficiency, unspecified: Secondary | ICD-10-CM | POA: Diagnosis not present

## 2019-12-12 DIAGNOSIS — N951 Menopausal and female climacteric states: Secondary | ICD-10-CM | POA: Diagnosis not present

## 2019-12-12 DIAGNOSIS — E7849 Other hyperlipidemia: Secondary | ICD-10-CM | POA: Diagnosis not present

## 2019-12-12 DIAGNOSIS — R5383 Other fatigue: Secondary | ICD-10-CM | POA: Diagnosis not present

## 2019-12-21 DIAGNOSIS — L932 Other local lupus erythematosus: Secondary | ICD-10-CM | POA: Diagnosis not present

## 2020-01-01 DIAGNOSIS — I10 Essential (primary) hypertension: Secondary | ICD-10-CM | POA: Diagnosis not present

## 2020-01-04 DIAGNOSIS — M79672 Pain in left foot: Secondary | ICD-10-CM | POA: Diagnosis not present

## 2020-01-04 DIAGNOSIS — B351 Tinea unguium: Secondary | ICD-10-CM | POA: Diagnosis not present

## 2020-01-04 DIAGNOSIS — L84 Corns and callosities: Secondary | ICD-10-CM | POA: Diagnosis not present

## 2020-01-04 DIAGNOSIS — M79671 Pain in right foot: Secondary | ICD-10-CM | POA: Diagnosis not present

## 2020-01-08 DIAGNOSIS — L932 Other local lupus erythematosus: Secondary | ICD-10-CM | POA: Diagnosis not present

## 2020-01-23 DIAGNOSIS — H20041 Secondary noninfectious iridocyclitis, right eye: Secondary | ICD-10-CM | POA: Diagnosis not present

## 2020-02-12 DIAGNOSIS — I10 Essential (primary) hypertension: Secondary | ICD-10-CM | POA: Diagnosis not present

## 2020-02-27 DIAGNOSIS — Z823 Family history of stroke: Secondary | ICD-10-CM | POA: Diagnosis not present

## 2020-02-27 DIAGNOSIS — Z8249 Family history of ischemic heart disease and other diseases of the circulatory system: Secondary | ICD-10-CM | POA: Diagnosis not present

## 2020-02-27 DIAGNOSIS — Z818 Family history of other mental and behavioral disorders: Secondary | ICD-10-CM | POA: Diagnosis not present

## 2020-02-27 DIAGNOSIS — L309 Dermatitis, unspecified: Secondary | ICD-10-CM | POA: Diagnosis not present

## 2020-02-27 DIAGNOSIS — Z79890 Hormone replacement therapy (postmenopausal): Secondary | ICD-10-CM | POA: Diagnosis not present

## 2020-02-27 DIAGNOSIS — Z9181 History of falling: Secondary | ICD-10-CM | POA: Diagnosis not present

## 2020-02-27 DIAGNOSIS — R030 Elevated blood-pressure reading, without diagnosis of hypertension: Secondary | ICD-10-CM | POA: Diagnosis not present

## 2020-02-29 DIAGNOSIS — I10 Essential (primary) hypertension: Secondary | ICD-10-CM | POA: Diagnosis not present

## 2020-02-29 DIAGNOSIS — L853 Xerosis cutis: Secondary | ICD-10-CM | POA: Diagnosis not present

## 2020-03-07 DIAGNOSIS — L84 Corns and callosities: Secondary | ICD-10-CM | POA: Diagnosis not present

## 2020-03-07 DIAGNOSIS — M79672 Pain in left foot: Secondary | ICD-10-CM | POA: Diagnosis not present

## 2020-03-07 DIAGNOSIS — B351 Tinea unguium: Secondary | ICD-10-CM | POA: Diagnosis not present

## 2020-03-07 DIAGNOSIS — M79671 Pain in right foot: Secondary | ICD-10-CM | POA: Diagnosis not present

## 2020-03-11 DIAGNOSIS — N951 Menopausal and female climacteric states: Secondary | ICD-10-CM | POA: Diagnosis not present

## 2020-03-11 DIAGNOSIS — R5383 Other fatigue: Secondary | ICD-10-CM | POA: Diagnosis not present

## 2020-03-11 DIAGNOSIS — R7989 Other specified abnormal findings of blood chemistry: Secondary | ICD-10-CM | POA: Diagnosis not present

## 2020-03-11 DIAGNOSIS — E7849 Other hyperlipidemia: Secondary | ICD-10-CM | POA: Diagnosis not present

## 2020-03-11 DIAGNOSIS — I10 Essential (primary) hypertension: Secondary | ICD-10-CM | POA: Diagnosis not present

## 2020-03-11 DIAGNOSIS — E559 Vitamin D deficiency, unspecified: Secondary | ICD-10-CM | POA: Diagnosis not present

## 2020-03-20 DIAGNOSIS — I10 Essential (primary) hypertension: Secondary | ICD-10-CM | POA: Diagnosis not present

## 2020-04-14 DIAGNOSIS — I10 Essential (primary) hypertension: Secondary | ICD-10-CM | POA: Diagnosis not present

## 2020-05-09 DIAGNOSIS — L84 Corns and callosities: Secondary | ICD-10-CM | POA: Diagnosis not present

## 2020-05-09 DIAGNOSIS — M79671 Pain in right foot: Secondary | ICD-10-CM | POA: Diagnosis not present

## 2020-05-09 DIAGNOSIS — B351 Tinea unguium: Secondary | ICD-10-CM | POA: Diagnosis not present

## 2020-05-09 DIAGNOSIS — M79672 Pain in left foot: Secondary | ICD-10-CM | POA: Diagnosis not present

## 2020-06-17 DIAGNOSIS — E7849 Other hyperlipidemia: Secondary | ICD-10-CM | POA: Diagnosis not present

## 2020-06-17 DIAGNOSIS — R5383 Other fatigue: Secondary | ICD-10-CM | POA: Diagnosis not present

## 2020-06-17 DIAGNOSIS — E559 Vitamin D deficiency, unspecified: Secondary | ICD-10-CM | POA: Diagnosis not present

## 2020-06-17 DIAGNOSIS — N951 Menopausal and female climacteric states: Secondary | ICD-10-CM | POA: Diagnosis not present

## 2020-06-17 DIAGNOSIS — R7989 Other specified abnormal findings of blood chemistry: Secondary | ICD-10-CM | POA: Diagnosis not present

## 2020-06-24 DIAGNOSIS — L932 Other local lupus erythematosus: Secondary | ICD-10-CM | POA: Diagnosis not present

## 2020-07-16 DIAGNOSIS — Z0000 Encounter for general adult medical examination without abnormal findings: Secondary | ICD-10-CM | POA: Diagnosis not present

## 2020-07-16 DIAGNOSIS — I10 Essential (primary) hypertension: Secondary | ICD-10-CM | POA: Diagnosis not present

## 2020-07-16 DIAGNOSIS — Z79899 Other long term (current) drug therapy: Secondary | ICD-10-CM | POA: Diagnosis not present

## 2020-07-16 DIAGNOSIS — Z780 Asymptomatic menopausal state: Secondary | ICD-10-CM | POA: Diagnosis not present

## 2020-07-16 DIAGNOSIS — D649 Anemia, unspecified: Secondary | ICD-10-CM | POA: Diagnosis not present

## 2020-07-16 DIAGNOSIS — E7800 Pure hypercholesterolemia, unspecified: Secondary | ICD-10-CM | POA: Diagnosis not present

## 2020-07-16 DIAGNOSIS — M8580 Other specified disorders of bone density and structure, unspecified site: Secondary | ICD-10-CM | POA: Diagnosis not present

## 2020-07-16 DIAGNOSIS — Z1389 Encounter for screening for other disorder: Secondary | ICD-10-CM | POA: Diagnosis not present

## 2020-07-18 DIAGNOSIS — M79672 Pain in left foot: Secondary | ICD-10-CM | POA: Diagnosis not present

## 2020-07-18 DIAGNOSIS — B351 Tinea unguium: Secondary | ICD-10-CM | POA: Diagnosis not present

## 2020-07-18 DIAGNOSIS — M79671 Pain in right foot: Secondary | ICD-10-CM | POA: Diagnosis not present

## 2020-07-19 ENCOUNTER — Other Ambulatory Visit: Payer: Self-pay | Admitting: Geriatric Medicine

## 2020-07-19 DIAGNOSIS — M8580 Other specified disorders of bone density and structure, unspecified site: Secondary | ICD-10-CM

## 2020-07-25 ENCOUNTER — Telehealth: Payer: Self-pay | Admitting: *Deleted

## 2020-07-25 NOTE — Telephone Encounter (Signed)
Per 07/25/20 referral - Dr. Felipa Eth - called patient and gave upcoming appointments - patient confirmed - mailed calendar with welcome packet

## 2020-08-13 ENCOUNTER — Ambulatory Visit: Payer: Medicare PPO | Admitting: Hematology & Oncology

## 2020-08-13 ENCOUNTER — Other Ambulatory Visit: Payer: Medicare PPO

## 2020-08-20 ENCOUNTER — Inpatient Hospital Stay: Payer: Medicare PPO | Admitting: Hematology & Oncology

## 2020-08-20 ENCOUNTER — Other Ambulatory Visit: Payer: Self-pay

## 2020-08-20 ENCOUNTER — Inpatient Hospital Stay: Payer: Medicare PPO | Attending: Hematology & Oncology

## 2020-08-20 ENCOUNTER — Encounter: Payer: Self-pay | Admitting: Hematology & Oncology

## 2020-08-20 VITALS — BP 183/65 | HR 61 | Temp 98.3°F | Resp 17 | Wt 116.1 lb

## 2020-08-20 DIAGNOSIS — D500 Iron deficiency anemia secondary to blood loss (chronic): Secondary | ICD-10-CM

## 2020-08-20 DIAGNOSIS — R760 Raised antibody titer: Secondary | ICD-10-CM | POA: Insufficient documentation

## 2020-08-20 DIAGNOSIS — D472 Monoclonal gammopathy: Secondary | ICD-10-CM

## 2020-08-20 DIAGNOSIS — I7300 Raynaud's syndrome without gangrene: Secondary | ICD-10-CM | POA: Diagnosis not present

## 2020-08-20 DIAGNOSIS — D509 Iron deficiency anemia, unspecified: Secondary | ICD-10-CM | POA: Diagnosis not present

## 2020-08-20 DIAGNOSIS — M25551 Pain in right hip: Secondary | ICD-10-CM | POA: Diagnosis not present

## 2020-08-20 DIAGNOSIS — K644 Residual hemorrhoidal skin tags: Secondary | ICD-10-CM | POA: Diagnosis not present

## 2020-08-20 LAB — CMP (CANCER CENTER ONLY)
ALT: 18 U/L (ref 0–44)
AST: 22 U/L (ref 15–41)
Albumin: 4.1 g/dL (ref 3.5–5.0)
Alkaline Phosphatase: 68 U/L (ref 38–126)
Anion gap: 7 (ref 5–15)
BUN: 25 mg/dL — ABNORMAL HIGH (ref 8–23)
CO2: 32 mmol/L (ref 22–32)
Calcium: 10.2 mg/dL (ref 8.9–10.3)
Chloride: 104 mmol/L (ref 98–111)
Creatinine: 0.82 mg/dL (ref 0.44–1.00)
GFR, Estimated: >60 mL/min (ref >60)
Glucose, Bld: 84 mg/dL (ref 70–99)
Potassium: 3.5 mmol/L (ref 3.5–5.1)
Sodium: 143 mmol/L (ref 135–145)
Total Bilirubin: 0.4 mg/dL (ref 0.3–1.2)
Total Protein: 7.4 g/dL (ref 6.5–8.1)

## 2020-08-20 LAB — CBC WITH DIFFERENTIAL (CANCER CENTER ONLY)
Abs Immature Granulocytes: 0.02 10*3/uL (ref 0.00–0.07)
Basophils Absolute: 0 10*3/uL (ref 0.0–0.1)
Basophils Relative: 1 %
Eosinophils Absolute: 0.1 10*3/uL (ref 0.0–0.5)
Eosinophils Relative: 3 %
HCT: 30.7 % — ABNORMAL LOW (ref 36.0–46.0)
Hemoglobin: 9.7 g/dL — ABNORMAL LOW (ref 12.0–15.0)
Immature Granulocytes: 1 %
Lymphocytes Relative: 25 %
Lymphs Abs: 1.1 10*3/uL (ref 0.7–4.0)
MCH: 25 pg — ABNORMAL LOW (ref 26.0–34.0)
MCHC: 31.6 g/dL (ref 30.0–36.0)
MCV: 79.1 fL — ABNORMAL LOW (ref 80.0–100.0)
Monocytes Absolute: 0.5 10*3/uL (ref 0.1–1.0)
Monocytes Relative: 12 %
Neutro Abs: 2.5 10*3/uL (ref 1.7–7.7)
Neutrophils Relative %: 58 %
Platelet Count: 117 10*3/uL — ABNORMAL LOW (ref 150–400)
RBC: 3.88 MIL/uL (ref 3.87–5.11)
RDW: 18.7 % — ABNORMAL HIGH (ref 11.5–15.5)
WBC Count: 4.3 10*3/uL (ref 4.0–10.5)
nRBC: 0 % (ref 0.0–0.2)

## 2020-08-20 LAB — RETICULOCYTES
Immature Retic Fract: 16.2 % — ABNORMAL HIGH (ref 2.3–15.9)
RBC.: 3.88 MIL/uL (ref 3.87–5.11)
Retic Count, Absolute: 36.5 10*3/uL (ref 19.0–186.0)
Retic Ct Pct: 0.9 % (ref 0.4–3.1)

## 2020-08-20 LAB — SAVE SMEAR(SSMR), FOR PROVIDER SLIDE REVIEW

## 2020-08-20 LAB — LACTATE DEHYDROGENASE: LDH: 193 U/L — ABNORMAL HIGH (ref 98–192)

## 2020-08-20 NOTE — Progress Notes (Signed)
Referral MD  Reason for Referral: Microcytic anemia  Chief Complaint  Patient presents with   New Patient (Initial Visit)  : I am anemic.  HPI: Kristina Brooks is incredibly interesting 86 year old African-American female.  She is originally from [place].  She has a Designer, jewellery in music education.  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  Because of the Raynaud's syndrome, she and her husband had a move from the cold [facility].  They actually ended up in [place].  They are there for 6 years.  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  They ultimately ended up in [place].  She is followed quite closely by Dr. Felipa Eth.  He has noticed that she is more anemic.  He actually found that she did have a monoclonal spike when she had some blood work done back in June.  There was a monoclonal spike of 0.7 g/dL.  Again it is hard to say what this really means.  She does have an underlying collagen vascular problem.  She has a positive ANA and I think rheumatoid factor.  She has noted in no obvious bleeding.  There is no melena or bright red blood per rectum.  She says her last colonoscopy was probably over 10 years ago.  There is no history of sickle cell.  She is really not had any surgeries outside of that with a C-section.  There is no weight loss or weight gain.  She is not a vegetarian.  She has had no problems with COVID.  She actually does see a rheumatologist I think who works in [place], [place].  This is obviously virtual visit.  She has been having pain in her right hip.  The last x-ray that I can see on her was back in November 2021.  This was unremarkable.  There is no lesions or arthritic changes.  She has had no rashes.  She is not noted any swollen lymph nodes.  There is no cough or shortness of breath.  She has not smoked.  She has rare alcohol drink.  She says that there is a history of anemia in the family.  She really is on very few medications.  Currently, I would  have to say her performance status is probably ECOG 1.   No past medical history on file.:  No past surgical history on file.:   Current Outpatient Medications:    azelastine (ASTELIN) 0.1 % nasal spray, Place into both nostrils 2 (two) times daily. Use in each nostril as directed, Disp: , Rfl:    fluticasone (FLONASE) 50 MCG/ACT nasal spray, Place into both nostrils daily., Disp: , Rfl:    PROGESTERONE PO, Take 50 mg by mouth daily at 6 (six) AM., Disp: , Rfl:    triamcinolone cream (KENALOG) 0.1 %, Apply 1 application topically 2 (two) times daily., Disp: , Rfl: :  :   Allergies  Allergen Reactions   Penicillins   :  No family history on file.:   Social History   Socioeconomic History   Marital status: Legally Separated    Spouse name: Not on file   Number of children: Not on file   Years of education: Not on file   Highest education level: Not on file  Occupational History   Not on file  Tobacco Use   Smoking status: Never   Smokeless tobacco: Never  Substance and Sexual Activity   Alcohol use: Not Currently   Drug use: Not Currently   Sexual activity: Not on  file  Other Topics Concern   Not on file  Social History Narrative   Not on file   Social Determinants of Health   Financial Resource Strain: Not on file  Food Insecurity: Not on file  Transportation Needs: Not on file  Physical Activity: Not on file  Stress: Not on file  Social Connections: Not on file  Intimate Partner Violence: Not on file  :  Review of Systems  Constitutional: Negative.   HENT: Negative.    Eyes: Negative.   Respiratory: Negative.    Cardiovascular: Negative.   Gastrointestinal: Negative.   Genitourinary: Negative.   Musculoskeletal: Negative.   Skin: Negative.   Neurological: Negative.   Endo/Heme/Allergies: Negative.   Psychiatric/Behavioral: Negative.      Exam:  This is a thin but well-nourished African-American female who certainly does not look her age.  Her  vital signs are temperature of 98.3.  Pulse 61.  Blood pressure 183/65.  Weight is 116 pounds.  Head neck exam shows no ocular or oral lesions.  She has no scleral icterus.  She has no adenopathy in the neck.  There is no palpable thyroid.  Lungs are clear bilaterally.  Cardiac exam regular rate and rhythm with no murmurs, rubs or bruits.  Abdomen is soft.  She has good bowel sounds.  There is no fluid wave.  There is no palpable liver or spleen tip.  Back exam shows no tenderness over the spine, ribs or hips.  Extremities shows no clubbing, cyanosis or edema.  There may be little bit of tenderness to palpation in the right hip area.  Skin exam shows no rashes, ecchymoses or petechia.  Neurological exam shows no focal neurological deficits.  Skin exam shows no rashes, ecchymoses or petechia. @IPVITALS @  Recent Labs    08/20/20 1121  WBC 4.3  HGB 9.7*  HCT 30.7*  PLT 117*    Recent Labs    08/20/20 1121  NA 143  K 3.5  CL 104  CO2 32  GLUCOSE 84  BUN 25*  CREATININE 0.82  CALCIUM 10.2    Blood smear review: There is anisocytosis and poikilocytosis.  She has no nucleated red blood cells.  She has teardrop cells.  She has a target cells.  There is no rouleaux formation.  White blood cells appear normal in morphology maturation.  There is no hypersegmented polys.  She has no immature myeloid or lymphoid cells.  Platelets are adequate number and size.  Pathology: None    Assessment and Plan: Kristina Brooks is a very nice 86 year old African-American female.  She has moderate anemia.  I think was important as reticulocyte count when uncorrected is only 0.9.  I would have to think that this is going to be from iron deficiency.  She certainly could have some thalassemia given that the she had target cells.  We will send off a thalassemia study.  Again will be interesting to see what her iron studies look like.  I forgot to mention that I did do a rectal exam on her.  There is no mass in the  rectal vault.  She had an external hemorrhoid.  Stool is brown and heme negative.  Again if she is iron deficient, I suppose she may have some element of malabsorption.  She could always have an element of myelodysplasia.  At her age, myelodysplasia is certainly a possibility.  I have noted that her platelet count was down.  However, she has had low platelets for probably 10  years.  This could be part of the collagen vascular issue that she might have.  I cannot find anything on her exam that looked suspicious.  I do not think we have to do any radiologic studies on her.  I think that this monoclonal spike that she had is more an MGUS.  Again she has an underlying collagen vascular issue that probably is causing a reactive M spike.  I spent a good hour with her.  She is very very nice.  I enjoyed talking with her.  We will see what the iron studies look like.  I also sent off an erythropoietin level on her.  I think we can get her blood count back up.  She really is not all that symptomatic with the anemia.  Again, I will plan to get her back once we have the iron results back in.

## 2020-08-21 ENCOUNTER — Telehealth: Payer: Self-pay | Admitting: *Deleted

## 2020-08-21 LAB — IRON AND TIBC
Iron: 51 ug/dL (ref 41–142)
Saturation Ratios: 22 % (ref 21–57)
TIBC: 226 ug/dL — ABNORMAL LOW (ref 236–444)
UIBC: 176 ug/dL (ref 120–384)

## 2020-08-21 LAB — KAPPA/LAMBDA LIGHT CHAINS
Kappa free light chain: 24.5 mg/L — ABNORMAL HIGH (ref 3.3–19.4)
Kappa, lambda light chain ratio: 0.72 (ref 0.26–1.65)
Lambda free light chains: 34 mg/L — ABNORMAL HIGH (ref 5.7–26.3)

## 2020-08-21 LAB — ERYTHROPOIETIN: Erythropoietin: 58.1 m[IU]/mL — ABNORMAL HIGH (ref 2.6–18.5)

## 2020-08-21 LAB — IGG, IGA, IGM
IgA: 157 mg/dL (ref 64–422)
IgG (Immunoglobin G), Serum: 1390 mg/dL (ref 586–1602)
IgM (Immunoglobulin M), Srm: 54 mg/dL (ref 26–217)

## 2020-08-21 LAB — FERRITIN: Ferritin: 462 ng/mL — ABNORMAL HIGH (ref 11–307)

## 2020-08-21 NOTE — Telephone Encounter (Signed)
No 08/20/20 LOS

## 2020-08-26 LAB — IMMUNOFIXATION REFLEX, SERUM
IgA: 150 mg/dL (ref 64–422)
IgG (Immunoglobin G), Serum: 1565 mg/dL (ref 586–1602)
IgM (Immunoglobulin M), Srm: 51 mg/dL (ref 26–217)

## 2020-08-26 LAB — PROTEIN ELECTROPHORESIS, SERUM, WITH REFLEX
A/G Ratio: 1.2 (ref 0.7–1.7)
Albumin ELP: 3.7 g/dL (ref 2.9–4.4)
Alpha-1-Globulin: 0.3 g/dL (ref 0.0–0.4)
Alpha-2-Globulin: 0.9 g/dL (ref 0.4–1.0)
Beta Globulin: 0.9 g/dL (ref 0.7–1.3)
Gamma Globulin: 1.1 g/dL (ref 0.4–1.8)
Globulin, Total: 3.1 g/dL (ref 2.2–3.9)
M-Spike, %: 0.6 g/dL — ABNORMAL HIGH
SPEP Interpretation: 0
Total Protein ELP: 6.8 g/dL (ref 6.0–8.5)

## 2020-08-30 LAB — ALPHA-THALASSEMIA GENOTYPR

## 2020-09-20 DIAGNOSIS — M79671 Pain in right foot: Secondary | ICD-10-CM | POA: Diagnosis not present

## 2020-09-20 DIAGNOSIS — B351 Tinea unguium: Secondary | ICD-10-CM | POA: Diagnosis not present

## 2020-09-20 DIAGNOSIS — M79672 Pain in left foot: Secondary | ICD-10-CM | POA: Diagnosis not present

## 2020-09-24 DIAGNOSIS — E559 Vitamin D deficiency, unspecified: Secondary | ICD-10-CM | POA: Diagnosis not present

## 2020-09-24 DIAGNOSIS — E7849 Other hyperlipidemia: Secondary | ICD-10-CM | POA: Diagnosis not present

## 2020-09-24 DIAGNOSIS — N951 Menopausal and female climacteric states: Secondary | ICD-10-CM | POA: Diagnosis not present

## 2020-09-24 DIAGNOSIS — R7989 Other specified abnormal findings of blood chemistry: Secondary | ICD-10-CM | POA: Diagnosis not present

## 2020-09-24 DIAGNOSIS — R5383 Other fatigue: Secondary | ICD-10-CM | POA: Diagnosis not present

## 2020-09-30 ENCOUNTER — Telehealth: Payer: Self-pay

## 2020-09-30 DIAGNOSIS — L932 Other local lupus erythematosus: Secondary | ICD-10-CM | POA: Diagnosis not present

## 2020-10-24 DIAGNOSIS — N951 Menopausal and female climacteric states: Secondary | ICD-10-CM | POA: Diagnosis not present

## 2020-10-25 DIAGNOSIS — R21 Rash and other nonspecific skin eruption: Secondary | ICD-10-CM | POA: Diagnosis not present

## 2020-10-25 DIAGNOSIS — J301 Allergic rhinitis due to pollen: Secondary | ICD-10-CM | POA: Diagnosis not present

## 2020-10-25 DIAGNOSIS — J3081 Allergic rhinitis due to animal (cat) (dog) hair and dander: Secondary | ICD-10-CM | POA: Diagnosis not present

## 2020-10-25 DIAGNOSIS — J3089 Other allergic rhinitis: Secondary | ICD-10-CM | POA: Diagnosis not present

## 2020-10-29 DIAGNOSIS — N951 Menopausal and female climacteric states: Secondary | ICD-10-CM | POA: Diagnosis not present

## 2020-10-29 DIAGNOSIS — Z1231 Encounter for screening mammogram for malignant neoplasm of breast: Secondary | ICD-10-CM | POA: Diagnosis not present

## 2020-11-06 ENCOUNTER — Other Ambulatory Visit: Payer: Self-pay | Admitting: *Deleted

## 2020-11-06 DIAGNOSIS — D509 Iron deficiency anemia, unspecified: Secondary | ICD-10-CM

## 2020-11-07 ENCOUNTER — Telehealth: Payer: Self-pay | Admitting: *Deleted

## 2020-11-07 ENCOUNTER — Encounter: Payer: Self-pay | Admitting: Hematology & Oncology

## 2020-11-07 ENCOUNTER — Other Ambulatory Visit: Payer: Self-pay

## 2020-11-07 ENCOUNTER — Inpatient Hospital Stay: Payer: Medicare PPO | Admitting: Hematology & Oncology

## 2020-11-07 ENCOUNTER — Inpatient Hospital Stay: Payer: Medicare PPO | Attending: Hematology & Oncology

## 2020-11-07 VITALS — BP 135/67 | HR 57 | Temp 87.8°F | Resp 18 | Ht 64.0 in | Wt 114.0 lb

## 2020-11-07 DIAGNOSIS — D500 Iron deficiency anemia secondary to blood loss (chronic): Secondary | ICD-10-CM

## 2020-11-07 DIAGNOSIS — D563 Thalassemia minor: Secondary | ICD-10-CM | POA: Diagnosis not present

## 2020-11-07 DIAGNOSIS — I7300 Raynaud's syndrome without gangrene: Secondary | ICD-10-CM | POA: Diagnosis not present

## 2020-11-07 DIAGNOSIS — D509 Iron deficiency anemia, unspecified: Secondary | ICD-10-CM | POA: Diagnosis not present

## 2020-11-07 DIAGNOSIS — D472 Monoclonal gammopathy: Secondary | ICD-10-CM | POA: Diagnosis not present

## 2020-11-07 LAB — CBC WITH DIFFERENTIAL (CANCER CENTER ONLY)
Abs Immature Granulocytes: 0.04 10*3/uL (ref 0.00–0.07)
Basophils Absolute: 0 10*3/uL (ref 0.0–0.1)
Basophils Relative: 1 %
Eosinophils Absolute: 0.2 10*3/uL (ref 0.0–0.5)
Eosinophils Relative: 4 %
HCT: 33.4 % — ABNORMAL LOW (ref 36.0–46.0)
Hemoglobin: 10.9 g/dL — ABNORMAL LOW (ref 12.0–15.0)
Immature Granulocytes: 1 %
Lymphocytes Relative: 29 %
Lymphs Abs: 1.2 10*3/uL (ref 0.7–4.0)
MCH: 25.8 pg — ABNORMAL LOW (ref 26.0–34.0)
MCHC: 32.6 g/dL (ref 30.0–36.0)
MCV: 79 fL — ABNORMAL LOW (ref 80.0–100.0)
Monocytes Absolute: 0.4 10*3/uL (ref 0.1–1.0)
Monocytes Relative: 9 %
Neutro Abs: 2.3 10*3/uL (ref 1.7–7.7)
Neutrophils Relative %: 56 %
Platelet Count: 123 10*3/uL — ABNORMAL LOW (ref 150–400)
RBC: 4.23 MIL/uL (ref 3.87–5.11)
RDW: 18.2 % — ABNORMAL HIGH (ref 11.5–15.5)
WBC Count: 4.1 10*3/uL (ref 4.0–10.5)
nRBC: 0 % (ref 0.0–0.2)

## 2020-11-07 LAB — CMP (CANCER CENTER ONLY)
ALT: 16 U/L (ref 0–44)
AST: 21 U/L (ref 15–41)
Albumin: 4.2 g/dL (ref 3.5–5.0)
Alkaline Phosphatase: 69 U/L (ref 38–126)
Anion gap: 6 (ref 5–15)
BUN: 20 mg/dL (ref 8–23)
CO2: 33 mmol/L — ABNORMAL HIGH (ref 22–32)
Calcium: 9.9 mg/dL (ref 8.9–10.3)
Chloride: 104 mmol/L (ref 98–111)
Creatinine: 0.81 mg/dL (ref 0.44–1.00)
GFR, Estimated: >60 mL/min (ref >60)
Glucose, Bld: 93 mg/dL (ref 70–99)
Potassium: 3.3 mmol/L — ABNORMAL LOW (ref 3.5–5.1)
Sodium: 143 mmol/L (ref 135–145)
Total Bilirubin: 0.5 mg/dL (ref 0.3–1.2)
Total Protein: 7.3 g/dL (ref 6.5–8.1)

## 2020-11-07 LAB — RETIC PANEL
Immature Retic Fract: 12.2 % (ref 2.3–15.9)
RBC.: 4.2 MIL/uL (ref 3.87–5.11)
Retic Count, Absolute: 35.3 10*3/uL (ref 19.0–186.0)
Retic Ct Pct: 0.8 % (ref 0.4–3.1)
Reticulocyte Hemoglobin: 27.8 pg — ABNORMAL LOW (ref >27.9)

## 2020-11-07 LAB — IRON AND TIBC
Iron: 65 ug/dL (ref 41–142)
Saturation Ratios: 27 % (ref 21–57)
TIBC: 241 ug/dL (ref 236–444)
UIBC: 176 ug/dL (ref 120–384)

## 2020-11-07 LAB — FERRITIN: Ferritin: 401 ng/mL — ABNORMAL HIGH (ref 11–307)

## 2020-11-07 NOTE — Telephone Encounter (Signed)
Per 11/07/20 los gave upcoming appointments - confirmed - print calendar

## 2020-11-07 NOTE — Progress Notes (Signed)
Hematology and Oncology Follow Up Visit  Kristina Brooks [id number] 15-Jun-1934 86 y.o. 11/07/2020   Principle Diagnosis:  IgG Lambda MGUS Anemia of iron deficiency Anemia of erythropoietin deficiency Alpha thalassemia trait  Current Therapy:   Observation     Interim History:  Kristina Brooks is back for follow-up.  This is Kristina Brooks second office visit.  We first saw Kristina Brooks back in July.  At that time, she had some anemia and thrombocytopenia.  We did do a work-up on Kristina Brooks.  She does have a IgG lambda MGUS.  Kristina Brooks monoclonal spike was 0.6 g/dL.  Kristina Brooks IgG level was 1450 mg/dL.  The lambda light chain 3.4 mg/dL.  Kristina Brooks iron studies showed a ferritin 462 with an iron saturation of 22%.  She is taking supplements.  Kristina Brooks blood pressure is doing much better.  She is on quite a few supplements to try to help with Kristina Brooks blood pressure.  She also has Raynaud's.  I told Kristina Brooks if she wanted to take some further Raynaud's, garlic would be a decent option for Kristina Brooks.  This is a somewhat vasodilator.  She does have alpha thalassemia trait.  However, this is minor.  Kristina Brooks erythropoietin level is 58.  She is still watching out for the [place].  She and Kristina Brooks husband really do not go anywhere.  She is exercising more.  She has had no problems with fever.  She has had no cough or shortness of breath.  There has been no rashes.  She has had no leg swelling.  She has had no headache.  Currently, I would have to say that Kristina Brooks performance status is probably ECOG 1.  Medications:  Current Outpatient Medications:    fluticasone (FLONASE) 50 MCG/ACT nasal spray, Place into both nostrils daily., Disp: , Rfl:    PROGESTERONE PO, Take 50 mg by mouth daily at 6 (six) AM., Disp: , Rfl:    triamcinolone cream (KENALOG) 0.1 %, Apply 1 application topically 2 (two) times daily., Disp: , Rfl:   Allergies:  Allergies  Allergen Reactions   Penicillins Other (See Comments)    Unknown Reaction    Past Medical History, Surgical  history, Social history, and Family History were reviewed and updated.  Review of Systems: Review of Systems  Constitutional: Negative.   HENT:  Negative.    Eyes: Negative.   Respiratory: Negative.    Cardiovascular: Negative.   Gastrointestinal: Negative.   Endocrine: Negative.   Genitourinary: Negative.    Musculoskeletal: Negative.   Skin: Negative.   Neurological: Negative.   Hematological: Negative.   Psychiatric/Behavioral: Negative.     Physical Exam:  height is 5\' 4"  (1.626 m) and weight is 114 lb (51.7 kg). Kristina Brooks oral temperature is 87.8 F (31 C) (abnormal). Kristina Brooks blood pressure is 135/67 and Kristina Brooks pulse is 57 (abnormal). Kristina Brooks respiration is 18 and oxygen saturation is 100%.   Wt Readings from Last 3 Encounters:  11/07/20 114 lb (51.7 kg)  08/20/20 116 lb 1.3 oz (52.7 kg)    Physical Exam Vitals reviewed.  HENT:     Head: Normocephalic and atraumatic.  Eyes:     Pupils: Pupils are equal, round, and reactive to light.  Cardiovascular:     Rate and Rhythm: Normal rate and regular rhythm.     Heart sounds: Normal heart sounds.  Pulmonary:     Effort: Pulmonary effort is normal.     Breath sounds: Normal breath sounds.  Abdominal:     General: Bowel sounds are normal.  Palpations: Abdomen is soft.  Musculoskeletal:        General: No tenderness or deformity. Normal range of motion.     Cervical back: Normal range of motion.  Lymphadenopathy:     Cervical: No cervical adenopathy.  Skin:    General: Skin is warm and dry.     Findings: No erythema or rash.  Neurological:     Mental Status: She is alert and oriented to person, place, and time.  Psychiatric:        Behavior: Behavior normal.        Thought Content: Thought content normal.        Judgment: Judgment normal.     Lab Results  Component Value Date   WBC 4.1 11/07/2020   HGB 10.9 (L) 11/07/2020   HCT 33.4 (L) 11/07/2020   MCV 79.0 (L) 11/07/2020   PLT 123 (L) 11/07/2020     Chemistry       Component Value Date/Time   NA 143 11/07/2020 1001   K 3.3 (L) 11/07/2020 1001   CL 104 11/07/2020 1001   CO2 33 (H) 11/07/2020 1001   BUN 20 11/07/2020 1001   CREATININE 0.81 11/07/2020 1001      Component Value Date/Time   CALCIUM 9.9 11/07/2020 1001   ALKPHOS 69 11/07/2020 1001   AST 21 11/07/2020 1001   ALT 16 11/07/2020 1001   BILITOT 0.5 11/07/2020 1001     Impression and Plan: Kristina Brooks is a very nice 86 year old African-American female.  She certainly looks a lot younger.  She is incredibly accomplished.  She has been to [place].  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  We will have to see what Kristina Brooks iron studies look like.  We could always use ESA if we needed to.  We will have to see what Kristina Brooks monoclonal studies look like.  She is not that symptomatic.  She is on Kristina Brooks supplements.  She is happy with taking Kristina Brooks supplements.  We will plan to get Kristina Brooks back after all the holidays now.  I think this would be very reasonable.   [name], MD 10/6/202211:02 AM

## 2020-11-07 NOTE — Telephone Encounter (Signed)
-----   Message from [name], MD sent at 11/07/2020 12:47 PM EDT ----- Call and let her know that the iron levels are still okay.  Thanks.Marland Kitchen

## 2020-11-07 NOTE — Telephone Encounter (Signed)
As noted below by Dr. Marin Olp, I informed the patient that the iron levels are still OK. She verbalized understanding.

## 2020-11-08 LAB — IGG, IGA, IGM
IgA: 138 mg/dL (ref 64–422)
IgG (Immunoglobin G), Serum: 1326 mg/dL (ref 586–1602)
IgM (Immunoglobulin M), Srm: 51 mg/dL (ref 26–217)

## 2020-11-08 LAB — KAPPA/LAMBDA LIGHT CHAINS
Kappa free light chain: 32.5 mg/L — ABNORMAL HIGH (ref 3.3–19.4)
Kappa, lambda light chain ratio: 0.92 (ref 0.26–1.65)
Lambda free light chains: 35.2 mg/L — ABNORMAL HIGH (ref 5.7–26.3)

## 2020-11-25 DIAGNOSIS — M79671 Pain in right foot: Secondary | ICD-10-CM | POA: Diagnosis not present

## 2020-11-25 DIAGNOSIS — B351 Tinea unguium: Secondary | ICD-10-CM | POA: Diagnosis not present

## 2020-11-25 DIAGNOSIS — M79672 Pain in left foot: Secondary | ICD-10-CM | POA: Diagnosis not present

## 2020-12-05 DIAGNOSIS — N951 Menopausal and female climacteric states: Secondary | ICD-10-CM | POA: Diagnosis not present

## 2020-12-30 DIAGNOSIS — R7989 Other specified abnormal findings of blood chemistry: Secondary | ICD-10-CM | POA: Diagnosis not present

## 2020-12-30 DIAGNOSIS — N951 Menopausal and female climacteric states: Secondary | ICD-10-CM | POA: Diagnosis not present

## 2020-12-30 DIAGNOSIS — E7849 Other hyperlipidemia: Secondary | ICD-10-CM | POA: Diagnosis not present

## 2020-12-30 DIAGNOSIS — R5383 Other fatigue: Secondary | ICD-10-CM | POA: Diagnosis not present

## 2020-12-30 DIAGNOSIS — E559 Vitamin D deficiency, unspecified: Secondary | ICD-10-CM | POA: Diagnosis not present

## 2021-01-07 DIAGNOSIS — L932 Other local lupus erythematosus: Secondary | ICD-10-CM | POA: Diagnosis not present

## 2021-01-13 ENCOUNTER — Ambulatory Visit
Admission: RE | Admit: 2021-01-13 | Discharge: 2021-01-13 | Disposition: A | Discharge status: Home / Self Care | Payer: Medicare PPO | Source: Ambulatory Visit | Attending: Geriatric Medicine | Admitting: Geriatric Medicine

## 2021-01-13 DIAGNOSIS — M8580 Other specified disorders of bone density and structure, unspecified site: Secondary | ICD-10-CM

## 2021-01-13 DIAGNOSIS — M8589 Other specified disorders of bone density and structure, multiple sites: Secondary | ICD-10-CM | POA: Diagnosis not present

## 2021-01-14 DIAGNOSIS — I10 Essential (primary) hypertension: Secondary | ICD-10-CM | POA: Diagnosis not present

## 2021-02-04 DIAGNOSIS — B351 Tinea unguium: Secondary | ICD-10-CM | POA: Diagnosis not present

## 2021-02-04 DIAGNOSIS — M79672 Pain in left foot: Secondary | ICD-10-CM | POA: Diagnosis not present

## 2021-02-04 DIAGNOSIS — M79671 Pain in right foot: Secondary | ICD-10-CM | POA: Diagnosis not present

## 2021-02-13 ENCOUNTER — Inpatient Hospital Stay: Payer: Medicare PPO | Attending: Hematology & Oncology

## 2021-02-13 ENCOUNTER — Inpatient Hospital Stay: Payer: Medicare PPO | Admitting: Hematology & Oncology

## 2021-02-13 ENCOUNTER — Encounter: Payer: Self-pay | Admitting: Hematology & Oncology

## 2021-02-13 ENCOUNTER — Other Ambulatory Visit: Payer: Self-pay

## 2021-02-13 ENCOUNTER — Telehealth: Payer: Self-pay | Admitting: *Deleted

## 2021-02-13 VITALS — BP 147/59 | HR 54 | Temp 97.6°F | Resp 16 | Wt 115.0 lb

## 2021-02-13 DIAGNOSIS — D472 Monoclonal gammopathy: Secondary | ICD-10-CM | POA: Diagnosis not present

## 2021-02-13 DIAGNOSIS — D509 Iron deficiency anemia, unspecified: Secondary | ICD-10-CM | POA: Diagnosis not present

## 2021-02-13 DIAGNOSIS — D563 Thalassemia minor: Secondary | ICD-10-CM | POA: Diagnosis not present

## 2021-02-13 DIAGNOSIS — D500 Iron deficiency anemia secondary to blood loss (chronic): Secondary | ICD-10-CM

## 2021-02-13 LAB — CMP (CANCER CENTER ONLY)
ALT: 21 U/L (ref 0–44)
AST: 25 U/L (ref 15–41)
Albumin: 4.4 g/dL (ref 3.5–5.0)
Alkaline Phosphatase: 67 U/L (ref 38–126)
Anion gap: 5 (ref 5–15)
BUN: 22 mg/dL (ref 8–23)
CO2: 34 mmol/L — ABNORMAL HIGH (ref 22–32)
Calcium: 10.3 mg/dL (ref 8.9–10.3)
Chloride: 103 mmol/L (ref 98–111)
Creatinine: 0.8 mg/dL (ref 0.44–1.00)
GFR, Estimated: >60 mL/min (ref >60)
Glucose, Bld: 87 mg/dL (ref 70–99)
Potassium: 3.8 mmol/L (ref 3.5–5.1)
Sodium: 142 mmol/L (ref 135–145)
Total Bilirubin: 0.5 mg/dL (ref 0.3–1.2)
Total Protein: 7.1 g/dL (ref 6.5–8.1)

## 2021-02-13 LAB — CBC WITH DIFFERENTIAL (CANCER CENTER ONLY)
Abs Immature Granulocytes: 0.04 10*3/uL (ref 0.00–0.07)
Basophils Absolute: 0 10*3/uL (ref 0.0–0.1)
Basophils Relative: 0 %
Eosinophils Absolute: 0.2 10*3/uL (ref 0.0–0.5)
Eosinophils Relative: 5 %
HCT: 33.6 % — ABNORMAL LOW (ref 36.0–46.0)
Hemoglobin: 10.9 g/dL — ABNORMAL LOW (ref 12.0–15.0)
Immature Granulocytes: 1 %
Lymphocytes Relative: 38 %
Lymphs Abs: 1.3 10*3/uL (ref 0.7–4.0)
MCH: 26 pg (ref 26.0–34.0)
MCHC: 32.4 g/dL (ref 30.0–36.0)
MCV: 80.2 fL (ref 80.0–100.0)
Monocytes Absolute: 0.4 10*3/uL (ref 0.1–1.0)
Monocytes Relative: 12 %
Neutro Abs: 1.6 10*3/uL — ABNORMAL LOW (ref 1.7–7.7)
Neutrophils Relative %: 44 %
Platelet Count: 109 10*3/uL — ABNORMAL LOW (ref 150–400)
RBC: 4.19 MIL/uL (ref 3.87–5.11)
RDW: 17.8 % — ABNORMAL HIGH (ref 11.5–15.5)
WBC Count: 3.5 10*3/uL — ABNORMAL LOW (ref 4.0–10.5)
nRBC: 0 % (ref 0.0–0.2)

## 2021-02-13 LAB — FERRITIN: Ferritin: 339 ng/mL — ABNORMAL HIGH (ref 11–307)

## 2021-02-13 LAB — RETICULOCYTES
Immature Retic Fract: 12.9 % (ref 2.3–15.9)
RBC.: 4.22 MIL/uL (ref 3.87–5.11)
Retic Count, Absolute: 38.8 10*3/uL (ref 19.0–186.0)
Retic Ct Pct: 0.9 % (ref 0.4–3.1)

## 2021-02-13 LAB — IRON AND IRON BINDING CAPACITY (CC-WL,HP ONLY)
Iron: 76 ug/dL (ref 28–170)
Saturation Ratios: 25 % (ref 10.4–31.8)
TIBC: 305 ug/dL (ref 250–450)
UIBC: 229 ug/dL (ref 148–442)

## 2021-02-13 LAB — LACTATE DEHYDROGENASE: LDH: 220 U/L — ABNORMAL HIGH (ref 98–192)

## 2021-02-13 NOTE — Telephone Encounter (Signed)
Per 12/04/21 los - gave upcoming appointments - confirmed 

## 2021-02-13 NOTE — Progress Notes (Signed)
Hematology and Oncology Follow Up Visit  Kristina Brooks [id number] 03-13-1934 87 y.o. 02/13/2021   Principle Diagnosis:  IgG Lambda MGUS Anemia of iron deficiency Anemia of erythropoietin deficiency Alpha thalassemia trait  Current Therapy:   Observation     Interim History:  Kristina Brooks is back for follow-up.  We last saw her back in October.  Since then, she had been doing pretty well.  She really has had no issues with COVID.  She still very diligent with avoiding COVID.  She has had no problems with nausea or vomiting.  Her appetite has been good.  She had a fairly quiet holiday season.  Her iron studies today show a ferritin of 339 with an iron saturation of 25%.  We last saw her back in October, her IgG level was 1326 mg/dL.  She has had no problems with rashes.  There is been no change in bowel or bladder habits.  She has had no cough or shortness of breath.  She has had no headache.  Overall, I would have to say that her performance status is ECOG 1.  She is exercising more.   Medications:  Current Outpatient Medications:    Chromic Chloride POWD, See admin instructions., Disp: , Rfl:    fluticasone (FLONASE) 50 MCG/ACT nasal spray, Place into both nostrils daily., Disp: , Rfl:    NON FORMULARY, alpha gaba pm, Disp: , Rfl:    NON FORMULARY, carbon 98 bx, Disp: , Rfl:    NON FORMULARY, formula a16b, Disp: , Rfl:    NON FORMULARY, progesterone sr, Disp: , Rfl:    NON FORMULARY, pyridoxal 1,2,3,4, Disp: , Rfl:    NON FORMULARY, Wu Jin San (fulvic acid supplement, Disp: , Rfl:    NON FORMULARY, , Disp: , Rfl:    PROGESTERONE PO, Take 50 mg by mouth daily at 6 (six) AM., Disp: , Rfl:    triamcinolone cream (KENALOG) 0.1 %, Apply 1 application topically 2 (two) times daily., Disp: , Rfl:   Allergies:  Allergies  Allergen Reactions   Bioflavonoids Other (See Comments)   Chocolate Flavor Other (See Comments)   Penicillin G Other (See Comments)   Tomato  (Diagnostic) Other (See Comments)   Penicillins Other (See Comments)    Unknown Reaction    Past Medical History, Surgical history, Social history, and Family History were reviewed and updated.  Review of Systems: Review of Systems  Constitutional: Negative.   HENT:  Negative.    Eyes: Negative.   Respiratory: Negative.    Cardiovascular: Negative.   Gastrointestinal: Negative.   Endocrine: Negative.   Genitourinary: Negative.    Musculoskeletal: Negative.   Skin: Negative.   Neurological: Negative.   Hematological: Negative.   Psychiatric/Behavioral: Negative.     Physical Exam:  weight is 115 lb (52.2 kg). Her oral temperature is 97.6 F (36.4 C). Her blood pressure is 147/59 (abnormal) and her pulse is 54 (abnormal). Her respiration is 16 and oxygen saturation is 93%.   Wt Readings from Last 3 Encounters:  02/13/21 115 lb (52.2 kg)  11/07/20 114 lb (51.7 kg)  08/20/20 116 lb 1.3 oz (52.7 kg)    Physical Exam Vitals reviewed.  HENT:     Head: Normocephalic and atraumatic.  Eyes:     Pupils: Pupils are equal, round, and reactive to light.  Cardiovascular:     Rate and Rhythm: Normal rate and regular rhythm.     Heart sounds: Normal heart sounds.  Pulmonary:  Effort: Pulmonary effort is normal.     Breath sounds: Normal breath sounds.  Abdominal:     General: Bowel sounds are normal.     Palpations: Abdomen is soft.  Musculoskeletal:        General: No tenderness or deformity. Normal range of motion.     Cervical back: Normal range of motion.  Lymphadenopathy:     Cervical: No cervical adenopathy.  Skin:    General: Skin is warm and dry.     Findings: No erythema or rash.  Neurological:     Mental Status: She is alert and oriented to person, place, and time.  Psychiatric:        Behavior: Behavior normal.        Thought Content: Thought content normal.        Judgment: Judgment normal.     Lab Results  Component Value Date   WBC 3.5 (L) 02/13/2021    HGB 10.9 (L) 02/13/2021   HCT 33.6 (L) 02/13/2021   MCV 80.2 02/13/2021   PLT 109 (L) 02/13/2021     Chemistry      Component Value Date/Time   NA 142 02/13/2021 0906   K 3.8 02/13/2021 0906   CL 103 02/13/2021 0906   CO2 34 (H) 02/13/2021 0906   BUN 22 02/13/2021 0906   CREATININE 0.80 02/13/2021 0906      Component Value Date/Time   CALCIUM 10.3 02/13/2021 0906   ALKPHOS 67 02/13/2021 0906   AST 25 02/13/2021 0906   ALT 21 02/13/2021 0906   BILITOT 0.5 02/13/2021 0906     Impression and Plan: Kristina Brooks is a very nice 87 year old African-American female.  She certainly looks a lot younger.  She is incredibly accomplished.  She has been to [place].  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  Her blood counts are holding pretty steady.  We will have to see what the monoclonal studies show.  Her iron levels look fine.  Thankfully we do not have to give her any IV iron.  Overall, I think she is holding steady.  She does take supplements.  I will see any problems with her taking supplements.  We will go ahead and plan to get her back to see [place] in another 6 months now.   [name], MD 1/12/20231:35 PM

## 2021-02-14 LAB — KAPPA/LAMBDA LIGHT CHAINS
Kappa free light chain: 26.4 mg/L — ABNORMAL HIGH (ref 3.3–19.4)
Kappa, lambda light chain ratio: 0.76 (ref 0.26–1.65)
Lambda free light chains: 34.7 mg/L — ABNORMAL HIGH (ref 5.7–26.3)

## 2021-02-14 LAB — IGG, IGA, IGM
IgA: 135 mg/dL (ref 64–422)
IgG (Immunoglobin G), Serum: 1316 mg/dL (ref 586–1602)
IgM (Immunoglobulin M), Srm: 47 mg/dL (ref 26–217)

## 2021-02-18 ENCOUNTER — Telehealth: Payer: Self-pay

## 2021-02-18 LAB — PROTEIN ELECTROPHORESIS, SERUM, WITH REFLEX
A/G Ratio: 1.1 (ref 0.7–1.7)
Albumin ELP: 3.8 g/dL (ref 2.9–4.4)
Alpha-1-Globulin: 0.3 g/dL (ref 0.0–0.4)
Alpha-2-Globulin: 0.8 g/dL (ref 0.4–1.0)
Beta Globulin: 1 g/dL (ref 0.7–1.3)
Gamma Globulin: 1.3 g/dL (ref 0.4–1.8)
Globulin, Total: 3.4 g/dL (ref 2.2–3.9)
M-Spike, %: 0.7 g/dL — ABNORMAL HIGH
SPEP Interpretation: 0
Total Protein ELP: 7.2 g/dL (ref 6.0–8.5)

## 2021-02-18 LAB — IMMUNOFIXATION REFLEX, SERUM
IgA: 134 mg/dL (ref 64–422)
IgG (Immunoglobin G), Serum: 1358 mg/dL (ref 586–1602)
IgM (Immunoglobulin M), Srm: 46 mg/dL (ref 26–217)

## 2021-02-18 NOTE — Telephone Encounter (Signed)
Called and informed patient of lab results, patient verbalized understanding and denies any questions or concerns at this time.   

## 2021-02-18 NOTE — Telephone Encounter (Signed)
-----   Message from [name], MD sent at 02/18/2021 11:15 AM EST ----- Call - the abnormal protein is low and stable!!  Kristina Brooks

## 2021-04-07 DIAGNOSIS — N951 Menopausal and female climacteric states: Secondary | ICD-10-CM | POA: Diagnosis not present

## 2021-04-07 DIAGNOSIS — R7989 Other specified abnormal findings of blood chemistry: Secondary | ICD-10-CM | POA: Diagnosis not present

## 2021-04-07 DIAGNOSIS — E7849 Other hyperlipidemia: Secondary | ICD-10-CM | POA: Diagnosis not present

## 2021-04-07 DIAGNOSIS — R5383 Other fatigue: Secondary | ICD-10-CM | POA: Diagnosis not present

## 2021-04-07 DIAGNOSIS — E559 Vitamin D deficiency, unspecified: Secondary | ICD-10-CM | POA: Diagnosis not present

## 2021-04-14 DIAGNOSIS — L932 Other local lupus erythematosus: Secondary | ICD-10-CM | POA: Diagnosis not present

## 2021-04-17 DIAGNOSIS — B351 Tinea unguium: Secondary | ICD-10-CM | POA: Diagnosis not present

## 2021-04-17 DIAGNOSIS — M79671 Pain in right foot: Secondary | ICD-10-CM | POA: Diagnosis not present

## 2021-04-17 DIAGNOSIS — M79672 Pain in left foot: Secondary | ICD-10-CM | POA: Diagnosis not present

## 2021-05-03 DIAGNOSIS — N951 Menopausal and female climacteric states: Secondary | ICD-10-CM | POA: Diagnosis not present

## 2021-05-12 DIAGNOSIS — L2082 Flexural eczema: Secondary | ICD-10-CM | POA: Diagnosis not present

## 2021-05-12 DIAGNOSIS — R030 Elevated blood-pressure reading, without diagnosis of hypertension: Secondary | ICD-10-CM | POA: Diagnosis not present

## 2021-05-12 DIAGNOSIS — T7840XA Allergy, unspecified, initial encounter: Secondary | ICD-10-CM | POA: Diagnosis not present

## 2021-05-16 DIAGNOSIS — Z79899 Other long term (current) drug therapy: Secondary | ICD-10-CM | POA: Diagnosis not present

## 2021-05-16 DIAGNOSIS — E785 Hyperlipidemia, unspecified: Secondary | ICD-10-CM | POA: Diagnosis not present

## 2021-05-16 DIAGNOSIS — N951 Menopausal and female climacteric states: Secondary | ICD-10-CM | POA: Diagnosis not present

## 2021-05-16 DIAGNOSIS — L309 Dermatitis, unspecified: Secondary | ICD-10-CM | POA: Diagnosis not present

## 2021-05-16 DIAGNOSIS — D696 Thrombocytopenia, unspecified: Secondary | ICD-10-CM | POA: Diagnosis not present

## 2021-05-16 DIAGNOSIS — R030 Elevated blood-pressure reading, without diagnosis of hypertension: Secondary | ICD-10-CM | POA: Diagnosis not present

## 2021-05-16 DIAGNOSIS — Z820 Family history of epilepsy and other diseases of the nervous system: Secondary | ICD-10-CM | POA: Diagnosis not present

## 2021-05-16 DIAGNOSIS — Z823 Family history of stroke: Secondary | ICD-10-CM | POA: Diagnosis not present

## 2021-05-16 DIAGNOSIS — J309 Allergic rhinitis, unspecified: Secondary | ICD-10-CM | POA: Diagnosis not present

## 2021-05-30 DIAGNOSIS — L309 Dermatitis, unspecified: Secondary | ICD-10-CM | POA: Diagnosis not present

## 2021-05-30 DIAGNOSIS — L2089 Other atopic dermatitis: Secondary | ICD-10-CM | POA: Diagnosis not present

## 2021-05-30 DIAGNOSIS — L280 Lichen simplex chronicus: Secondary | ICD-10-CM | POA: Diagnosis not present

## 2021-06-12 DIAGNOSIS — I10 Essential (primary) hypertension: Secondary | ICD-10-CM | POA: Diagnosis not present

## 2021-06-12 DIAGNOSIS — R21 Rash and other nonspecific skin eruption: Secondary | ICD-10-CM | POA: Diagnosis not present

## 2021-07-14 DIAGNOSIS — R7989 Other specified abnormal findings of blood chemistry: Secondary | ICD-10-CM | POA: Diagnosis not present

## 2021-07-14 DIAGNOSIS — R5383 Other fatigue: Secondary | ICD-10-CM | POA: Diagnosis not present

## 2021-07-14 DIAGNOSIS — E559 Vitamin D deficiency, unspecified: Secondary | ICD-10-CM | POA: Diagnosis not present

## 2021-07-14 DIAGNOSIS — N951 Menopausal and female climacteric states: Secondary | ICD-10-CM | POA: Diagnosis not present

## 2021-07-14 DIAGNOSIS — E7849 Other hyperlipidemia: Secondary | ICD-10-CM | POA: Diagnosis not present

## 2021-07-21 DIAGNOSIS — L932 Other local lupus erythematosus: Secondary | ICD-10-CM | POA: Diagnosis not present

## 2021-08-06 DIAGNOSIS — I10 Essential (primary) hypertension: Secondary | ICD-10-CM | POA: Diagnosis not present

## 2021-08-06 DIAGNOSIS — D709 Neutropenia, unspecified: Secondary | ICD-10-CM | POA: Diagnosis not present

## 2021-08-06 DIAGNOSIS — Z79899 Other long term (current) drug therapy: Secondary | ICD-10-CM | POA: Diagnosis not present

## 2021-08-06 DIAGNOSIS — E7800 Pure hypercholesterolemia, unspecified: Secondary | ICD-10-CM | POA: Diagnosis not present

## 2021-08-06 DIAGNOSIS — Z0000 Encounter for general adult medical examination without abnormal findings: Secondary | ICD-10-CM | POA: Diagnosis not present

## 2021-08-06 DIAGNOSIS — D696 Thrombocytopenia, unspecified: Secondary | ICD-10-CM | POA: Diagnosis not present

## 2021-08-13 ENCOUNTER — Inpatient Hospital Stay: Payer: Medicare PPO | Attending: Hematology & Oncology

## 2021-08-13 ENCOUNTER — Inpatient Hospital Stay: Payer: Medicare PPO | Admitting: Family

## 2021-08-13 ENCOUNTER — Ambulatory Visit: Payer: Medicare PPO | Admitting: Hematology & Oncology

## 2021-08-13 ENCOUNTER — Encounter: Payer: Self-pay | Admitting: Family

## 2021-08-13 VITALS — BP 137/48 | HR 51 | Temp 97.8°F | Resp 19 | Wt 112.0 lb

## 2021-08-13 DIAGNOSIS — D563 Thalassemia minor: Secondary | ICD-10-CM | POA: Insufficient documentation

## 2021-08-13 DIAGNOSIS — D500 Iron deficiency anemia secondary to blood loss (chronic): Secondary | ICD-10-CM

## 2021-08-13 DIAGNOSIS — D509 Iron deficiency anemia, unspecified: Secondary | ICD-10-CM | POA: Diagnosis not present

## 2021-08-13 DIAGNOSIS — D472 Monoclonal gammopathy: Secondary | ICD-10-CM

## 2021-08-13 LAB — CBC WITH DIFFERENTIAL (CANCER CENTER ONLY)
Abs Immature Granulocytes: 0.01 10*3/uL (ref 0.00–0.07)
Basophils Absolute: 0 10*3/uL (ref 0.0–0.1)
Basophils Relative: 1 %
Eosinophils Absolute: 0.2 10*3/uL (ref 0.0–0.5)
Eosinophils Relative: 6 %
HCT: 35.3 % — ABNORMAL LOW (ref 36.0–46.0)
Hemoglobin: 11.3 g/dL — ABNORMAL LOW (ref 12.0–15.0)
Immature Granulocytes: 0 %
Lymphocytes Relative: 32 %
Lymphs Abs: 1.2 10*3/uL (ref 0.7–4.0)
MCH: 26.5 pg (ref 26.0–34.0)
MCHC: 32 g/dL (ref 30.0–36.0)
MCV: 82.9 fL (ref 80.0–100.0)
Monocytes Absolute: 0.4 10*3/uL (ref 0.1–1.0)
Monocytes Relative: 11 %
Neutro Abs: 1.9 10*3/uL (ref 1.7–7.7)
Neutrophils Relative %: 50 %
Platelet Count: 128 10*3/uL — ABNORMAL LOW (ref 150–400)
RBC: 4.26 MIL/uL (ref 3.87–5.11)
RDW: 16.7 % — ABNORMAL HIGH (ref 11.5–15.5)
WBC Count: 3.9 10*3/uL — ABNORMAL LOW (ref 4.0–10.5)
nRBC: 0 % (ref 0.0–0.2)

## 2021-08-13 LAB — CMP (CANCER CENTER ONLY)
ALT: 20 U/L (ref 0–44)
AST: 23 U/L (ref 15–41)
Albumin: 4.4 g/dL (ref 3.5–5.0)
Alkaline Phosphatase: 65 U/L (ref 38–126)
Anion gap: 6 (ref 5–15)
BUN: 29 mg/dL — ABNORMAL HIGH (ref 8–23)
CO2: 32 mmol/L (ref 22–32)
Calcium: 9.9 mg/dL (ref 8.9–10.3)
Chloride: 103 mmol/L (ref 98–111)
Creatinine: 0.88 mg/dL (ref 0.44–1.00)
GFR, Estimated: >60 mL/min (ref >60)
Glucose, Bld: 104 mg/dL — ABNORMAL HIGH (ref 70–99)
Potassium: 3.4 mmol/L — ABNORMAL LOW (ref 3.5–5.1)
Sodium: 141 mmol/L (ref 135–145)
Total Bilirubin: 0.4 mg/dL (ref 0.3–1.2)
Total Protein: 7 g/dL (ref 6.5–8.1)

## 2021-08-13 LAB — LACTATE DEHYDROGENASE: LDH: 215 U/L — ABNORMAL HIGH (ref 98–192)

## 2021-08-13 LAB — SAVE SMEAR(SSMR), FOR PROVIDER SLIDE REVIEW

## 2021-08-13 NOTE — Progress Notes (Signed)
Hematology and Oncology Follow Up Visit  ASTER SCREWS [id number] [birth year] 87 y.o. 08/13/2021   Principle Diagnosis:  IgG Lambda MGUS Anemia of iron deficiency Anemia of erythropoietin deficiency Alpha thalassemia trait   Current Therapy:        Observation   Interim History:  Ms. Carbary is here today for follow-up. She is doing fairly well but is grieving the recent loss of her husband. Thankfully her daughter has moved close by and they are spending time together.  She has not noted any blood loss. No bruising or petechiae.  In January, her M-spike was 0.7 g/dL, IgG lambda light chains 1,316 mg/dL and lambda light chains 2.64 mg/dL.  No issue with infections. No fever, chills, n/v, cough, rash, dizziness, SOB, chest pain, palpitations, abdominal pain or changes in bowel or bladder habits.  No swelling, tenderness, numbness or tingling in her extremities.  No falls or syncope.  Appetite comes and goes. She is doing her best to stay well hydrated. Her weight is stable at 112 lbs.   ECOG Performance Status: 1 - Symptomatic but completely ambulatory  Medications:  Allergies as of 08/13/2021       Reactions   Bioflavonoids Other (See Comments)   Chocolate Flavor Other (See Comments)   Penicillin G Other (See Comments)   Tomato (diagnostic) Other (See Comments)   Penicillins Other (See Comments)   Unknown Reaction        Medication List        Accurate as of August 13, 2021  3:44 PM. If you have any questions, ask your nurse or doctor.          Chromic Chloride Powd See admin instructions.   fluticasone 50 MCG/ACT nasal spray Commonly known as: FLONASE Place into both nostrils daily.   NON FORMULARY alpha gaba pm   NON FORMULARY carbon 98 bx   NON FORMULARY formula a16b   NON FORMULARY progesterone sr   NON FORMULARY pyridoxal 1,2,3,4   NON FORMULARY Wu Jin San (fulvic acid supplement   NON FORMULARY   PROGESTERONE PO Take 50 mg by  mouth daily at 6 (six) AM.   triamcinolone cream 0.1 % Commonly known as: KENALOG Apply 1 application topically 2 (two) times daily.        Allergies:  Allergies  Allergen Reactions   Bioflavonoids Other (See Comments)   Chocolate Flavor Other (See Comments)   Penicillin G Other (See Comments)   Tomato (Diagnostic) Other (See Comments)   Penicillins Other (See Comments)    Unknown Reaction    Past Medical History, Surgical history, Social history, and Family History were reviewed and updated.  Review of Systems: All other 10 point review of systems is negative.   Physical Exam:  weight is 112 lb (50.8 kg). Her oral temperature is 97.8 F (36.6 C). Her blood pressure is 137/48 (abnormal) and her pulse is 51 (abnormal). Her respiration is 19.   Wt Readings from Last 3 Encounters:  08/13/21 112 lb (50.8 kg)  02/13/21 115 lb (52.2 kg)  11/07/20 114 lb (51.7 kg)    Ocular: Sclerae unicteric, pupils equal, round and reactive to light Ear-nose-throat: Oropharynx clear, dentition fair Lymphatic: No cervical or supraclavicular adenopathy Lungs no rales or rhonchi, good excursion bilaterally Heart regular rate and rhythm, no murmur appreciated Abd soft, nontender, positive bowel sounds MSK no focal spinal tenderness, no joint edema Neuro: non-focal, well-oriented, appropriate affect Breasts: deferred   Lab Results  Component Value Date   WBC 3.9 (  L) 08/13/2021   HGB 11.3 (L) 08/13/2021   HCT 35.3 (L) 08/13/2021   MCV 82.9 08/13/2021   PLT 128 (L) 08/13/2021   Lab Results  Component Value Date   FERRITIN 339 (H) 02/13/2021   IRON 76 02/13/2021   TIBC 305 02/13/2021   UIBC 229 02/13/2021   IRONPCTSAT 25 02/13/2021   Lab Results  Component Value Date   RETICCTPCT 0.9 02/13/2021   RBC 4.26 08/13/2021   Lab Results  Component Value Date   KPAFRELGTCHN 26.4 (H) 02/13/2021   LAMBDASER 34.7 (H) 02/13/2021   KAPLAMBRATIO 0.76 02/13/2021   Lab Results  Component  Value Date   IGGSERUM 1,316 02/13/2021   IGGSERUM 1,358 02/13/2021   IGA 135 02/13/2021   IGA 134 02/13/2021   IGMSERUM 47 02/13/2021   IGMSERUM 46 02/13/2021   Lab Results  Component Value Date   TOTALPROTELP 7.2 02/13/2021   ALBUMINELP 3.8 02/13/2021   A1GS 0.3 02/13/2021   A2GS 0.8 02/13/2021   BETS 1.0 02/13/2021   BETA2SER 4.3 08/09/2007   GAMS 1.3 02/13/2021   MSPIKE 0.7 (H) 02/13/2021   SPEI * 08/09/2007     Chemistry      Component Value Date/Time   NA 141 08/13/2021 1354   K 3.4 (L) 08/13/2021 1354   CL 103 08/13/2021 1354   CO2 32 08/13/2021 1354   BUN 29 (H) 08/13/2021 1354   CREATININE 0.88 08/13/2021 1354      Component Value Date/Time   CALCIUM 9.9 08/13/2021 1354   ALKPHOS 65 08/13/2021 1354   AST 23 08/13/2021 1354   ALT 20 08/13/2021 1354   BILITOT 0.4 08/13/2021 1354       Impression and Plan: Ms. Plummer is a very pleasant 87 yo African American female with IgG lambda MGUS and multifactorial anemia.  Protein studies are pending.  Follow-up in 6 months.   Lottie Dawson, NP 7/12/20233:44 PM

## 2021-08-14 ENCOUNTER — Encounter (INDEPENDENT_AMBULATORY_CARE_PROVIDER_SITE_OTHER): Payer: Self-pay | Admitting: Ophthalmology

## 2021-08-14 ENCOUNTER — Ambulatory Visit (INDEPENDENT_AMBULATORY_CARE_PROVIDER_SITE_OTHER): Payer: Medicare PPO | Admitting: Ophthalmology

## 2021-08-14 DIAGNOSIS — Z961 Presence of intraocular lens: Secondary | ICD-10-CM | POA: Insufficient documentation

## 2021-08-14 DIAGNOSIS — H2513 Age-related nuclear cataract, bilateral: Secondary | ICD-10-CM | POA: Diagnosis not present

## 2021-08-14 DIAGNOSIS — H43812 Vitreous degeneration, left eye: Secondary | ICD-10-CM | POA: Diagnosis not present

## 2021-08-14 DIAGNOSIS — Z8669 Personal history of other diseases of the nervous system and sense organs: Secondary | ICD-10-CM | POA: Diagnosis not present

## 2021-08-14 DIAGNOSIS — H353131 Nonexudative age-related macular degeneration, bilateral, early dry stage: Secondary | ICD-10-CM | POA: Diagnosis not present

## 2021-08-14 DIAGNOSIS — H33101 Unspecified retinoschisis, right eye: Secondary | ICD-10-CM

## 2021-08-14 LAB — IGG, IGA, IGM
IgA: 139 mg/dL (ref 64–422)
IgG (Immunoglobin G), Serum: 1271 mg/dL (ref 586–1602)
IgM (Immunoglobulin M), Srm: 111 mg/dL (ref 26–217)

## 2021-08-14 LAB — KAPPA/LAMBDA LIGHT CHAINS
Kappa free light chain: 25.5 mg/L — ABNORMAL HIGH (ref 3.3–19.4)
Kappa, lambda light chain ratio: 0.72 (ref 0.26–1.65)
Lambda free light chains: 35.2 mg/L — ABNORMAL HIGH (ref 5.7–26.3)

## 2021-08-14 NOTE — Assessment & Plan Note (Signed)
Resolved with cataract surgery, DR BEVIS

## 2021-08-14 NOTE — Progress Notes (Signed)
08/14/2021     CHIEF COMPLAINT Patient presents for  Chief Complaint  Patient presents with   Retina Evaluation      HISTORY OF PRESENT ILLNESS: Kristina Brooks is a 87 y.o. female who presents to the clinic today for:   HPI   2 year FU ou, fundus photos Pt states her vision has been stable Pt denies any floaters or FOL  Pt states she has had her cataracts removed and now believes she is farsighted pt states "I can drive at night now and I can read better with my glasses"  Last edited by Morene Rankins, CMA on 08/14/2021  2:14 PM.      Referring physician: Lajean Manes, MD [address],  [place] [address]  HISTORICAL INFORMATION:   Selected notes from the [facility]: No current outpatient medications on file. (Ophthalmic Drugs)   No current facility-administered medications for this visit. (Ophthalmic Drugs)   Current Outpatient Medications (Other)  Medication Sig   Chromic Chloride POWD See admin instructions.   fluticasone (FLONASE) 50 MCG/ACT nasal spray Place into both nostrils daily.   NON FORMULARY alpha gaba pm   NON FORMULARY carbon 98 bx   NON FORMULARY formula a16b   NON FORMULARY progesterone sr   NON FORMULARY pyridoxal 1,2,3,4   NON FORMULARY [place] (fulvic acid supplement   NON FORMULARY    PROGESTERONE PO Take 50 mg by mouth daily at 6 (six) AM.   triamcinolone cream (KENALOG) 0.1 % Apply 1 application topically 2 (two) times daily.   No current facility-administered medications for this visit. (Other)      REVIEW OF SYSTEMS: ROS   Negative for: Constitutional, Gastrointestinal, Neurological, Skin, Genitourinary, Musculoskeletal, HENT, Endocrine, Cardiovascular, Eyes, Respiratory, Psychiatric, Allergic/Imm, Heme/Lymph Last edited by Morene Rankins, CMA on 08/14/2021  2:14 PM.       ALLERGIES Allergies  Allergen Reactions   Bioflavonoids Other (See Comments)    Chocolate Flavor Other (See Comments)   Penicillin G Other (See Comments)   Tomato (Diagnostic) Other (See Comments)   Penicillins Other (See Comments)    Unknown Reaction    PAST MEDICAL HISTORY Past Medical History:  Diagnosis Date   Nuclear sclerotic cataract of both eyes 08/15/2019   History reviewed. No pertinent surgical history.  FAMILY HISTORY History reviewed. No pertinent family history.  SOCIAL HISTORY Social History   Tobacco Use   Smoking status: Never   Smokeless tobacco: Never  Vaping Use   Vaping Use: Never used  Substance Use Topics   Alcohol use: Not Currently   Drug use: Not Currently         OPHTHALMIC EXAM:  Base Eye Exam     Visual Acuity (ETDRS)       Right Left   Dist cc 20/20 20/20         Tonometry (Tonopen, 2:19 PM)       Right Left   Pressure 14 15         Neuro/Psych     Oriented x3: Yes   Mood/Affect: Normal         Dilation     Both eyes: 1.0% Mydriacyl, 2.5% Phenylephrine @ 2:15 PM           Slit Lamp and Fundus Exam     External Exam       Right Left   External Normal Normal  Slit Lamp Exam       Right Left   Lids/Lashes Normal Normal   Conjunctiva/Sclera White and quiet White and quiet   Cornea Clear Clear   Anterior Chamber Deep and quiet Deep and quiet   Iris Round and reactive Round and reactive   Lens 3+ Nuclear sclerosis, 3+ Cortical cataract 3+ Nuclear sclerosis, 3+ Cortical cataract   Anterior Vitreous Normal Normal         Fundus Exam       Right Left   Posterior Vitreous Normal Normal   Disc Normal Normal   C/D Ratio 0.45 0.45   Macula Normal Normal   Vessels Normal Normal   Periphery Retinal schisis inferotemporal, no retinal detachment, no inner or outer holes Good buckle, attached            IMAGING AND PROCEDURES  Imaging and Procedures for 08/15/21  Color Fundus Photography Optos - OU - Both Eyes       Right Eye Progression has been stable. Disc  findings include normal observations. Macula : normal observations. Vessels : normal observations.   Left Eye Disc findings include normal observations. Macula : normal observations. Vessels : normal observations.   Notes Good scleral buckle, OS     OCT, Retina - OU - Both Eyes       Right Eye Quality was borderline. Scan locations included subfoveal. Central Foveal Thickness: 253. Progression has been stable. Findings include normal foveal contour.   Left Eye Quality was borderline. Scan locations included subfoveal. Central Foveal Thickness: 252. Progression has been stable. Findings include normal foveal contour.   Notes Clear media             ASSESSMENT/PLAN:  Nuclear sclerotic cataract of both eyes Resolved with cataract surgery, DR BEVIS  Retinoschisis, right eye Stable over time.  History of retinal detachment OS retina attached.  Clear media  Pseudophakia, both eyes Stable OU  Early stage nonexudative age-related macular degeneration of both eyes Minimal no high risk features  Posterior vitreous detachment, left eye Physiologic OS no new breaks     ICD-10-CM   1. Retinoschisis, right eye  H33.101 Color Fundus Photography Optos - OU - Both Eyes    OCT, Retina - OU - Both Eyes    2. Nuclear sclerotic cataract of both eyes  H25.13     3. History of retinal detachment  Z86.69     4. Pseudophakia, both eyes  Z96.1     5. Early stage nonexudative age-related macular degeneration of both eyes  H35.3131     6. Posterior vitreous detachment, left eye  H43.812       1.  OS looks great with history of retinal detachment some 20 years previous.  Doing very well  2.  OD retinoschisis inferotemporal, no new breaks tears  3.  OU now with excellent visual acuity within the state of pseudophakia after cataract surgery performed last year by Dr. Audry Pili  Ophthalmic Meds Ordered this visit:  No orders of the defined types were placed in this  encounter.      Return in about 2 years (around 08/15/2023) for DILATE OU, COLOR FP, OCT.  There are no Patient Instructions on file for this visit.   Explained the diagnoses, plan, and follow up with the patient and they expressed understanding.  Patient expressed understanding of the importance of proper follow up care.   Clent Demark Lantz Hermann M.D. Diseases & Surgery of the Retina and Vitreous Retina & Diabetic  [place] 08/15/21     Abbreviations: M myopia (nearsighted); A astigmatism; H hyperopia (farsighted); P presbyopia; Mrx spectacle prescription;  CTL contact lenses; OD right eye; OS left eye; OU both eyes  XT exotropia; ET esotropia; PEK punctate epithelial keratitis; PEE punctate epithelial erosions; DES dry eye syndrome; MGD meibomian gland dysfunction; ATs artificial tears; PFAT's preservative free artificial tears; [place] nuclear sclerotic cataract; PSC posterior subcapsular cataract; ERM epi-retinal membrane; PVD posterior vitreous detachment; RD retinal detachment; DM diabetes mellitus; DR diabetic retinopathy; NPDR non-proliferative diabetic retinopathy; PDR proliferative diabetic retinopathy; CSME clinically significant macular edema; DME diabetic macular edema; dbh dot blot hemorrhages; CWS cotton wool spot; POAG primary open angle glaucoma; C/D cup-to-disc ratio; HVF humphrey visual field; GVF goldmann visual field; OCT optical coherence tomography; IOP intraocular pressure; BRVO Branch retinal vein occlusion; CRVO central retinal vein occlusion; CRAO central retinal artery occlusion; BRAO branch retinal artery occlusion; RT retinal tear; SB scleral buckle; PPV pars plana vitrectomy; VH Vitreous hemorrhage; PRP panretinal laser photocoagulation; IVK intravitreal kenalog; VMT vitreomacular traction; MH Macular hole;  NVD neovascularization of the disc; NVE neovascularization elsewhere; AREDS age related eye disease study; ARMD age related macular degeneration; POAG primary open angle  glaucoma; EBMD epithelial/anterior basement membrane dystrophy; ACIOL anterior chamber intraocular lens; IOL intraocular lens; PCIOL posterior chamber intraocular lens; Phaco/IOL phacoemulsification with intraocular lens placement; [place] photorefractive keratectomy; LASIK laser assisted in situ keratomileusis; HTN hypertension; DM diabetes mellitus; COPD chronic obstructive pulmonary disease

## 2021-08-14 NOTE — Assessment & Plan Note (Signed)
Physiologic OS no new breaks

## 2021-08-14 NOTE — Assessment & Plan Note (Signed)
Minimal no high risk features

## 2021-08-14 NOTE — Assessment & Plan Note (Signed)
Stable over time. 

## 2021-08-14 NOTE — Assessment & Plan Note (Signed)
OS retina attached.  Clear media

## 2021-08-14 NOTE — Assessment & Plan Note (Signed)
Stable OU 

## 2021-08-18 DIAGNOSIS — M79671 Pain in right foot: Secondary | ICD-10-CM | POA: Diagnosis not present

## 2021-08-18 DIAGNOSIS — B351 Tinea unguium: Secondary | ICD-10-CM | POA: Diagnosis not present

## 2021-08-18 DIAGNOSIS — M79672 Pain in left foot: Secondary | ICD-10-CM | POA: Diagnosis not present

## 2021-08-20 LAB — IMMUNOFIXATION REFLEX, SERUM
IgA: 129 mg/dL (ref 64–422)
IgG (Immunoglobin G), Serum: 1328 mg/dL (ref 586–1602)
IgM (Immunoglobulin M), Srm: 39 mg/dL (ref 26–217)

## 2021-08-20 LAB — PROTEIN ELECTROPHORESIS, SERUM, WITH REFLEX
A/G Ratio: 1.2 (ref 0.7–1.7)
Albumin ELP: 3.7 g/dL (ref 2.9–4.4)
Alpha-1-Globulin: 0.3 g/dL (ref 0.0–0.4)
Alpha-2-Globulin: 0.8 g/dL (ref 0.4–1.0)
Beta Globulin: 1 g/dL (ref 0.7–1.3)
Gamma Globulin: 1.1 g/dL (ref 0.4–1.8)
Globulin, Total: 3.2 g/dL (ref 2.2–3.9)
M-Spike, %: 0.5 g/dL — ABNORMAL HIGH
SPEP Interpretation: 0
Total Protein ELP: 6.9 g/dL (ref 6.0–8.5)

## 2021-08-21 ENCOUNTER — Telehealth: Payer: Self-pay | Admitting: *Deleted

## 2021-08-21 NOTE — Telephone Encounter (Signed)
Message received from patient wanting to know lab results from 08/13/21.  Call placed back to patient and message left to notify her that results are stable per order of Dr. Marin Olp.  Instructed pt to call office back with any questions or concerns.

## 2021-08-21 NOTE — Telephone Encounter (Signed)
Message received from patient requesting for lab results from 08/13/21 to be emailed to her or mailed to her.  Call placed back to patient and patient notified that lab results would be mailed to her home.  Pt is appreciative of call back and has no further questions at this time.

## 2021-08-26 DIAGNOSIS — L932 Other local lupus erythematosus: Secondary | ICD-10-CM | POA: Diagnosis not present

## 2021-09-29 DIAGNOSIS — L932 Other local lupus erythematosus: Secondary | ICD-10-CM | POA: Diagnosis not present

## 2021-09-30 DIAGNOSIS — H40011 Open angle with borderline findings, low risk, right eye: Secondary | ICD-10-CM | POA: Diagnosis not present

## 2021-10-09 DIAGNOSIS — L209 Atopic dermatitis, unspecified: Secondary | ICD-10-CM | POA: Diagnosis not present

## 2021-10-09 DIAGNOSIS — F4321 Adjustment disorder with depressed mood: Secondary | ICD-10-CM | POA: Diagnosis not present

## 2021-10-09 DIAGNOSIS — L280 Lichen simplex chronicus: Secondary | ICD-10-CM | POA: Diagnosis not present

## 2021-10-12 DIAGNOSIS — L932 Other local lupus erythematosus: Secondary | ICD-10-CM | POA: Diagnosis not present

## 2021-10-17 DIAGNOSIS — J301 Allergic rhinitis due to pollen: Secondary | ICD-10-CM | POA: Diagnosis not present

## 2021-10-17 DIAGNOSIS — J3089 Other allergic rhinitis: Secondary | ICD-10-CM | POA: Diagnosis not present

## 2021-10-17 DIAGNOSIS — J3081 Allergic rhinitis due to animal (cat) (dog) hair and dander: Secondary | ICD-10-CM | POA: Diagnosis not present

## 2021-10-17 DIAGNOSIS — R21 Rash and other nonspecific skin eruption: Secondary | ICD-10-CM | POA: Diagnosis not present

## 2021-10-20 DIAGNOSIS — R5383 Other fatigue: Secondary | ICD-10-CM | POA: Diagnosis not present

## 2021-10-20 DIAGNOSIS — E559 Vitamin D deficiency, unspecified: Secondary | ICD-10-CM | POA: Diagnosis not present

## 2021-10-20 DIAGNOSIS — E7849 Other hyperlipidemia: Secondary | ICD-10-CM | POA: Diagnosis not present

## 2021-10-20 DIAGNOSIS — N951 Menopausal and female climacteric states: Secondary | ICD-10-CM | POA: Diagnosis not present

## 2021-10-20 DIAGNOSIS — R7989 Other specified abnormal findings of blood chemistry: Secondary | ICD-10-CM | POA: Diagnosis not present

## 2021-10-28 DIAGNOSIS — L932 Other local lupus erythematosus: Secondary | ICD-10-CM | POA: Diagnosis not present

## 2021-11-03 DIAGNOSIS — B351 Tinea unguium: Secondary | ICD-10-CM | POA: Diagnosis not present

## 2021-11-03 DIAGNOSIS — M79672 Pain in left foot: Secondary | ICD-10-CM | POA: Diagnosis not present

## 2021-11-03 DIAGNOSIS — M79671 Pain in right foot: Secondary | ICD-10-CM | POA: Diagnosis not present

## 2021-11-04 DIAGNOSIS — N959 Unspecified menopausal and perimenopausal disorder: Secondary | ICD-10-CM | POA: Diagnosis not present

## 2021-11-04 DIAGNOSIS — Z1231 Encounter for screening mammogram for malignant neoplasm of breast: Secondary | ICD-10-CM | POA: Diagnosis not present

## 2021-11-04 DIAGNOSIS — Z01419 Encounter for gynecological examination (general) (routine) without abnormal findings: Secondary | ICD-10-CM | POA: Diagnosis not present

## 2021-11-04 DIAGNOSIS — Z681 Body mass index (BMI) 19 or less, adult: Secondary | ICD-10-CM | POA: Diagnosis not present

## 2021-11-21 DIAGNOSIS — I10 Essential (primary) hypertension: Secondary | ICD-10-CM | POA: Diagnosis not present

## 2021-12-19 DIAGNOSIS — L280 Lichen simplex chronicus: Secondary | ICD-10-CM | POA: Diagnosis not present

## 2021-12-19 DIAGNOSIS — L209 Atopic dermatitis, unspecified: Secondary | ICD-10-CM | POA: Diagnosis not present

## 2021-12-29 DIAGNOSIS — I10 Essential (primary) hypertension: Secondary | ICD-10-CM | POA: Diagnosis not present

## 2022-01-12 DIAGNOSIS — M79672 Pain in left foot: Secondary | ICD-10-CM | POA: Diagnosis not present

## 2022-01-12 DIAGNOSIS — M79671 Pain in right foot: Secondary | ICD-10-CM | POA: Diagnosis not present

## 2022-01-12 DIAGNOSIS — B351 Tinea unguium: Secondary | ICD-10-CM | POA: Diagnosis not present

## 2022-01-26 DIAGNOSIS — U071 COVID-19: Secondary | ICD-10-CM | POA: Diagnosis not present

## 2022-02-13 ENCOUNTER — Ambulatory Visit: Payer: Medicare PPO | Admitting: Hematology & Oncology

## 2022-02-13 ENCOUNTER — Other Ambulatory Visit: Payer: Medicare PPO

## 2022-02-16 ENCOUNTER — Inpatient Hospital Stay: Payer: Medicare PPO

## 2022-02-16 ENCOUNTER — Inpatient Hospital Stay: Payer: Medicare PPO | Attending: Hematology & Oncology | Admitting: Hematology & Oncology

## 2022-02-16 ENCOUNTER — Encounter: Payer: Self-pay | Admitting: Hematology & Oncology

## 2022-02-16 ENCOUNTER — Other Ambulatory Visit: Payer: Self-pay

## 2022-02-16 VITALS — BP 120/46 | HR 55 | Temp 97.8°F | Resp 18 | Ht 64.17 in | Wt 117.8 lb

## 2022-02-16 DIAGNOSIS — D509 Iron deficiency anemia, unspecified: Secondary | ICD-10-CM | POA: Diagnosis not present

## 2022-02-16 DIAGNOSIS — D563 Thalassemia minor: Secondary | ICD-10-CM | POA: Diagnosis not present

## 2022-02-16 DIAGNOSIS — D61818 Other pancytopenia: Secondary | ICD-10-CM | POA: Diagnosis not present

## 2022-02-16 DIAGNOSIS — L309 Dermatitis, unspecified: Secondary | ICD-10-CM | POA: Diagnosis not present

## 2022-02-16 DIAGNOSIS — D500 Iron deficiency anemia secondary to blood loss (chronic): Secondary | ICD-10-CM

## 2022-02-16 DIAGNOSIS — D472 Monoclonal gammopathy: Secondary | ICD-10-CM

## 2022-02-16 LAB — CBC WITH DIFFERENTIAL (CANCER CENTER ONLY)
Abs Immature Granulocytes: 0.01 10*3/uL (ref 0.00–0.07)
Basophils Absolute: 0 10*3/uL (ref 0.0–0.1)
Basophils Relative: 1 %
Eosinophils Absolute: 0.5 10*3/uL (ref 0.0–0.5)
Eosinophils Relative: 12 %
HCT: 34.3 % — ABNORMAL LOW (ref 36.0–46.0)
Hemoglobin: 10.9 g/dL — ABNORMAL LOW (ref 12.0–15.0)
Immature Granulocytes: 0 %
Lymphocytes Relative: 33 %
Lymphs Abs: 1.2 10*3/uL (ref 0.7–4.0)
MCH: 26.1 pg (ref 26.0–34.0)
MCHC: 31.8 g/dL (ref 30.0–36.0)
MCV: 82.1 fL (ref 80.0–100.0)
Monocytes Absolute: 0.4 10*3/uL (ref 0.1–1.0)
Monocytes Relative: 12 %
Neutro Abs: 1.6 10*3/uL — ABNORMAL LOW (ref 1.7–7.7)
Neutrophils Relative %: 42 %
Platelet Count: 135 10*3/uL — ABNORMAL LOW (ref 150–400)
RBC: 4.18 MIL/uL (ref 3.87–5.11)
RDW: 16.5 % — ABNORMAL HIGH (ref 11.5–15.5)
WBC Count: 3.7 10*3/uL — ABNORMAL LOW (ref 4.0–10.5)
nRBC: 0 % (ref 0.0–0.2)

## 2022-02-16 LAB — CMP (CANCER CENTER ONLY)
ALT: 19 U/L (ref 0–44)
AST: 23 U/L (ref 15–41)
Albumin: 4.2 g/dL (ref 3.5–5.0)
Alkaline Phosphatase: 56 U/L (ref 38–126)
Anion gap: 6 (ref 5–15)
BUN: 19 mg/dL (ref 8–23)
CO2: 31 mmol/L (ref 22–32)
Calcium: 10.1 mg/dL (ref 8.9–10.3)
Chloride: 103 mmol/L (ref 98–111)
Creatinine: 0.86 mg/dL (ref 0.44–1.00)
GFR, Estimated: >60 mL/min (ref >60)
Glucose, Bld: 84 mg/dL (ref 70–99)
Potassium: 3.6 mmol/L (ref 3.5–5.1)
Sodium: 140 mmol/L (ref 135–145)
Total Bilirubin: 0.4 mg/dL (ref 0.3–1.2)
Total Protein: 7.6 g/dL (ref 6.5–8.1)

## 2022-02-16 LAB — SAVE SMEAR(SSMR), FOR PROVIDER SLIDE REVIEW

## 2022-02-16 LAB — LACTATE DEHYDROGENASE: LDH: 229 U/L — ABNORMAL HIGH (ref 98–192)

## 2022-02-16 NOTE — Progress Notes (Signed)
Hematology and Oncology Follow Up Visit  Kristina Brooks [id number] 1935/01/05 88 y.o. 02/16/2022   Principle Diagnosis:  IgG Lambda MGUS Anemia of iron deficiency Anemia of erythropoietin deficiency Alpha thalassemia trait  Current Therapy:   Observation     Interim History:  Kristina Brooks is back for follow-up.  Her main problem now is eczema.  She has significant eczema.  She had lab work that was done recently at an outside lab.  She does have positive ANA titers.  She does have eczema.  She does not want to be put on immunosuppressive's.  I am sure that her dermatologist is trying her best to try to help with the eczema.  It does bother her quite a bit.  When we last saw her, her monoclonal spike was 0.5 g/dL.  Her IgG level was 1300 mg/dL.  She had normal IgM and IgA level.  The lambda light chain was 3.5 mg/dL.  I just feel bad that she is bothered by this eczema.  She has had no bleeding.  There is no change in bowel or bladder habits.  There is been no cough.  She has had no shortness of breath.  There is been no nausea or vomiting.  Her last iron studies that we did back in January, showed a ferritin of 339 with an iron saturation of 25%.  Overall, I would say that her performance status is probably ECOG 2.  Medications:  Current Outpatient Medications:    Chromic Chloride POWD, See admin instructions., Disp: , Rfl:    fluticasone (FLONASE) 50 MCG/ACT nasal spray, Place into both nostrils daily., Disp: , Rfl:    NON FORMULARY, alpha gaba pm, Disp: , Rfl:    NON FORMULARY, carbon 98 bx, Disp: , Rfl:    NON FORMULARY, formula a16b, Disp: , Rfl:    NON FORMULARY, progesterone sr, Disp: , Rfl:    NON FORMULARY, pyridoxal 1,2,3,4, Disp: , Rfl:    NON FORMULARY, Wu [place] San (fulvic acid supplement, Disp: , Rfl:    NON FORMULARY, , Disp: , Rfl:    PROGESTERONE PO, Take 50 mg by mouth daily at 6 (six) AM., Disp: , Rfl:    triamcinolone cream (KENALOG) 0.1 %, Apply 1  application topically 2 (two) times daily., Disp: , Rfl:   Allergies:  Allergies  Allergen Reactions   Bioflavonoids Rash   Chocolate Flavor Rash   Penicillin G Other (See Comments)   Penicillins Other (See Comments)    Unknown Reaction   Tomato (Diagnostic) Rash    Past Medical History, Surgical history, Social history, and Family History were reviewed and updated.  Review of Systems: Review of Systems  Constitutional: Negative.   HENT:  Negative.    Eyes: Negative.   Respiratory: Negative.    Cardiovascular: Negative.   Gastrointestinal: Negative.   Endocrine: Negative.   Genitourinary: Negative.    Musculoskeletal: Negative.   Skin: Negative.   Neurological: Negative.   Hematological: Negative.   Psychiatric/Behavioral: Negative.      Physical Exam:  height is 5' 4.17" (1.63 m) and weight is 117 lb 12.8 oz (53.4 kg). Her oral temperature is 97.8 F (36.6 C). Her blood pressure is 120/46 (abnormal) and her pulse is 55 (abnormal). Her respiration is 18 and oxygen saturation is 91%.   Wt Readings from Last 3 Encounters:  02/16/22 117 lb 12.8 oz (53.4 kg)  08/13/21 112 lb (50.8 kg)  02/13/21 115 lb (52.2 kg)    Physical Exam Vitals reviewed.  HENT:     Head: Normocephalic and atraumatic.  Eyes:     Pupils: Pupils are equal, round, and reactive to light.  Cardiovascular:     Rate and Rhythm: Normal rate and regular rhythm.     Heart sounds: Normal heart sounds.  Pulmonary:     Effort: Pulmonary effort is normal.     Breath sounds: Normal breath sounds.  Abdominal:     General: Bowel sounds are normal.     Palpations: Abdomen is soft.  Musculoskeletal:        General: No tenderness or deformity. Normal range of motion.     Cervical back: Normal range of motion.  Lymphadenopathy:     Cervical: No cervical adenopathy.  Skin:    General: Skin is warm and dry.     Findings: No erythema or rash.  Neurological:     Mental Status: She is alert and oriented to  person, place, and time.  Psychiatric:        Behavior: Behavior normal.        Thought Content: Thought content normal.        Judgment: Judgment normal.      Lab Results  Component Value Date   WBC 3.7 (L) 02/16/2022   HGB 10.9 (L) 02/16/2022   HCT 34.3 (L) 02/16/2022   MCV 82.1 02/16/2022   PLT 135 (L) 02/16/2022     Chemistry      Component Value Date/Time   NA 140 02/16/2022 1106   K 3.6 02/16/2022 1106   CL 103 02/16/2022 1106   CO2 31 02/16/2022 1106   BUN 19 02/16/2022 1106   CREATININE 0.86 02/16/2022 1106      Component Value Date/Time   CALCIUM 10.1 02/16/2022 1106   ALKPHOS 56 02/16/2022 1106   AST 23 02/16/2022 1106   ALT 19 02/16/2022 1106   BILITOT 0.4 02/16/2022 1106     Impression and Plan: Kristina Brooks is a very nice 88 year old African-American female.  She certainly looks a lot younger.  She is incredibly accomplished.  She has been to [place].  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  Again, the eczema is clearly her problem.  She does have the anemia.  She is slightly pancytopenic.  For right now, we will just watch this.  Again she has a positive ANA.  I suspect that there is some autoimmune condition that she might be dealing with.  I would like to get her back a little bit sooner.  I would like to get her back in April.  We will see how she is feeling at that point.    [name], MD 1/15/202411:55 AM

## 2022-02-17 LAB — KAPPA/LAMBDA LIGHT CHAINS
Kappa free light chain: 25.7 mg/L — ABNORMAL HIGH (ref 3.3–19.4)
Kappa, lambda light chain ratio: 0.81 (ref 0.26–1.65)
Lambda free light chains: 31.6 mg/L — ABNORMAL HIGH (ref 5.7–26.3)

## 2022-02-17 LAB — IGG, IGA, IGM
IgA: 150 mg/dL (ref 64–422)
IgG (Immunoglobin G), Serum: 1382 mg/dL (ref 586–1602)
IgM (Immunoglobulin M), Srm: 49 mg/dL (ref 26–217)

## 2022-02-19 LAB — PROTEIN ELECTROPHORESIS, SERUM
A/G Ratio: 1.2 (ref 0.7–1.7)
Albumin ELP: 3.8 g/dL (ref 2.9–4.4)
Alpha-1-Globulin: 0.3 g/dL (ref 0.0–0.4)
Alpha-2-Globulin: 0.9 g/dL (ref 0.4–1.0)
Beta Globulin: 1 g/dL (ref 0.7–1.3)
Gamma Globulin: 1.2 g/dL (ref 0.4–1.8)
Globulin, Total: 3.3 g/dL (ref 2.2–3.9)
M-Spike, %: 0.6 g/dL — ABNORMAL HIGH
Total Protein ELP: 7.1 g/dL (ref 6.0–8.5)

## 2022-04-13 ENCOUNTER — Emergency Department (HOSPITAL_BASED_OUTPATIENT_CLINIC_OR_DEPARTMENT_OTHER)
Admission: EM | Admit: 2022-04-13 | Discharge: 2022-04-13 | Disposition: A | Discharge status: Home / Self Care | Payer: Medicare PPO | Attending: Emergency Medicine | Admitting: Emergency Medicine

## 2022-04-13 ENCOUNTER — Other Ambulatory Visit: Payer: Self-pay

## 2022-04-13 ENCOUNTER — Emergency Department (HOSPITAL_BASED_OUTPATIENT_CLINIC_OR_DEPARTMENT_OTHER): Payer: Medicare PPO

## 2022-04-13 ENCOUNTER — Encounter (HOSPITAL_BASED_OUTPATIENT_CLINIC_OR_DEPARTMENT_OTHER): Payer: Self-pay

## 2022-04-13 DIAGNOSIS — M25531 Pain in right wrist: Secondary | ICD-10-CM | POA: Diagnosis not present

## 2022-04-13 DIAGNOSIS — W19XXXA Unspecified fall, initial encounter: Secondary | ICD-10-CM | POA: Insufficient documentation

## 2022-04-13 MED ORDER — OXYCODONE-ACETAMINOPHEN 5-325 MG PO TABS
1.0000 | ORAL_TABLET | Freq: Once | ORAL | Status: AC
  Administered 2022-04-13: 1 via ORAL
  Filled 2022-04-13: qty 1

## 2022-04-13 MED ORDER — MELOXICAM 7.5 MG PO TABS: 7.5000 mg | ORAL_TABLET | Freq: Every day | ORAL | 0 refills | Status: AC

## 2022-04-13 MED ORDER — MELOXICAM 7.5 MG PO TABS
15.0000 mg | ORAL_TABLET | Freq: Once | ORAL | Status: AC
  Administered 2022-04-13: 7.5 mg via ORAL

## 2022-04-13 NOTE — ED Triage Notes (Signed)
Pt BIB [facility] for right wrist pain after an object fell on her wrist. Pt from independent living facility called [facility].

## 2022-04-13 NOTE — ED Notes (Signed)
Pt has been OOB x2 with even and steady gait. AAOx4. Pt reports her pain has greatly improved and is aware as to why she is pending discharge. Pt has keys and d/c paperwork and has wrist brace applied. At this time, we will wait for medication to wear off a bit prior to d/c home in taxi. Pt verbalized understanding and is also stating she feels more than okay to walk to her apartment and get in on her own. She lives in an independent living facility by herself. Will call taxi at [phone] and get her ready for d/c.

## 2022-04-13 NOTE — ED Provider Notes (Signed)
[facility] Provider Note   CSN: [id number] Arrival date & time: 04/13/22  0028     History  Chief Complaint  Patient presents with   Wrist Pain    Kristina Brooks is a 88 y.o. female.  88 year old female who presents ER today with right wrist pain after something hit it falling off the top shelf.  Wrapped in [facility] by EMS.  Did not take anything for pain.  Hurts to move it.No other injuries.   Wrist Pain       Home Medications Prior to Admission medications   Medication Sig Start Date End Date Taking? Authorizing Provider  Chromic Chloride POWD See admin instructions.    [provider]  fluticasone (FLONASE) 50 MCG/ACT nasal spray Place into both nostrils daily.    [provider]  NON FORMULARY alpha gaba pm    [provider]  NON FORMULARY carbon 103 bx    [provider]  NON FORMULARY formula [phone]    [provider]  NON FORMULARY progesterone sr    [provider]  NON FORMULARY pyridoxal 1,2,3,4    [provider]  NON [place] (fulvic acid supplement    [provider]  NON FORMULARY     [provider]  PROGESTERONE PO Take 50 mg by mouth daily at 6 (six) AM.    [provider]  triamcinolone cream (KENALOG) 0.1 % Apply 1 application topically 2 (two) times daily.    [provider]      Allergies    Bioflavonoids, Chocolate flavor, Penicillin g, Penicillins, and Tomato (diagnostic)    Review of Systems   Review of Systems  Physical Exam Updated Vital Signs BP (!) 165/73   Pulse 60   Temp 98.4 F (36.9 C) (Oral)   Resp 19   Wt 53.1 kg   SpO2 100%   BMI 19.97 kg/m  Physical Exam Vitals and nursing note reviewed.  Constitutional:      Appearance: She is well-developed.  HENT:     Head: Normocephalic and atraumatic.     Mouth/Throat:     Pharynx: Oropharynx is clear.  Eyes:     Pupils:  Pupils are equal, round, and reactive to light.  Cardiovascular:     Rate and Rhythm: Normal rate and regular rhythm.  Pulmonary:     Effort: No respiratory distress.     Breath sounds: No stridor.  Abdominal:     General: There is no distension.  Musculoskeletal:        General: Tenderness (to palpation of right wrist and hand) present.     Cervical back: Normal range of motion.  Skin:    General: Skin is warm and dry.  Neurological:     General: No focal deficit present.     Mental Status: She is alert.     ED Results / Procedures / Treatments   Labs (all labs ordered are listed, but only abnormal results are displayed) Labs Reviewed - No data to display  EKG None  Radiology DG Wrist Complete Right  Result Date: 04/13/2022 CLINICAL DATA:  wrist pain EXAM: RIGHT WRIST - COMPLETE 3+ VIEW COMPARISON:  None Available. FINDINGS: There is no evidence of fracture or dislocation. First digit carpometacarpal joint degenerative changes. Soft tissues are unremarkable. IMPRESSION: No acute displaced fracture or dislocation. Electronically Signed   By: Iven Finn M.D.   On: 04/13/2022 01:32    Procedures  Procedures    Medications Ordered in ED Medications  meloxicam (MOBIC) tablet 15 mg (7.5 mg Oral Given 04/13/22 0249)  oxyCODONE-acetaminophen (PERCOCET/ROXICET) 5-325 MG per tablet 1 tablet (1 tablet Oral Given 04/13/22 0249)    ED Course/ Medical Decision Making/ A&P                             Medical Decision Making Amount and/or Complexity of Data Reviewed Radiology: ordered.  Risk Prescription drug management.   Xr negative. Will brace and pcp follow up for improving symptoms. NSAIDs, ICE, rest at home.    Final Clinical Impression(s) / ED Diagnoses Final diagnoses:  None    Rx / DC Orders ED Discharge Orders     None         Javier Gell, Corene Cornea, MD 04/13/22 [phone]

## 2022-04-17 ENCOUNTER — Inpatient Hospital Stay: Payer: Medicare PPO | Attending: Hematology & Oncology

## 2022-04-17 ENCOUNTER — Inpatient Hospital Stay: Payer: Medicare PPO | Admitting: Hematology & Oncology

## 2022-04-17 ENCOUNTER — Encounter: Payer: Self-pay | Admitting: Hematology & Oncology

## 2022-04-17 VITALS — BP 168/52 | HR 55 | Temp 97.7°F | Resp 20 | Ht 64.0 in | Wt 118.1 lb

## 2022-04-17 DIAGNOSIS — D472 Monoclonal gammopathy: Secondary | ICD-10-CM | POA: Diagnosis present

## 2022-04-17 DIAGNOSIS — D509 Iron deficiency anemia, unspecified: Secondary | ICD-10-CM | POA: Insufficient documentation

## 2022-04-17 DIAGNOSIS — D500 Iron deficiency anemia secondary to blood loss (chronic): Secondary | ICD-10-CM

## 2022-04-17 DIAGNOSIS — D563 Thalassemia minor: Secondary | ICD-10-CM | POA: Diagnosis not present

## 2022-04-17 DIAGNOSIS — L309 Dermatitis, unspecified: Secondary | ICD-10-CM

## 2022-04-17 LAB — CMP (CANCER CENTER ONLY)
ALT: 19 U/L (ref 0–44)
AST: 23 U/L (ref 15–41)
Albumin: 4.3 g/dL (ref 3.5–5.0)
Alkaline Phosphatase: 59 U/L (ref 38–126)
Anion gap: 7 (ref 5–15)
BUN: 26 mg/dL — ABNORMAL HIGH (ref 8–23)
CO2: 29 mmol/L (ref 22–32)
Calcium: 9.6 mg/dL (ref 8.9–10.3)
Chloride: 103 mmol/L (ref 98–111)
Creatinine: 0.79 mg/dL (ref 0.44–1.00)
GFR, Estimated: >60 mL/min (ref >60)
Glucose, Bld: 84 mg/dL (ref 70–99)
Potassium: 3.7 mmol/L (ref 3.5–5.1)
Sodium: 139 mmol/L (ref 135–145)
Total Bilirubin: 0.4 mg/dL (ref 0.3–1.2)
Total Protein: 7.4 g/dL (ref 6.5–8.1)

## 2022-04-17 LAB — CBC WITH DIFFERENTIAL (CANCER CENTER ONLY)
Abs Immature Granulocytes: 0.04 10*3/uL (ref 0.00–0.07)
Basophils Absolute: 0 10*3/uL (ref 0.0–0.1)
Basophils Relative: 0 %
Eosinophils Absolute: 0.2 10*3/uL (ref 0.0–0.5)
Eosinophils Relative: 5 %
HCT: 33 % — ABNORMAL LOW (ref 36.0–46.0)
Hemoglobin: 10.7 g/dL — ABNORMAL LOW (ref 12.0–15.0)
Immature Granulocytes: 1 %
Lymphocytes Relative: 26 %
Lymphs Abs: 1.2 10*3/uL (ref 0.7–4.0)
MCH: 26.4 pg (ref 26.0–34.0)
MCHC: 32.4 g/dL (ref 30.0–36.0)
MCV: 81.5 fL (ref 80.0–100.0)
Monocytes Absolute: 0.5 10*3/uL (ref 0.1–1.0)
Monocytes Relative: 10 %
Neutro Abs: 2.6 10*3/uL (ref 1.7–7.7)
Neutrophils Relative %: 58 %
Platelet Count: 117 10*3/uL — ABNORMAL LOW (ref 150–400)
RBC: 4.05 MIL/uL (ref 3.87–5.11)
RDW: 17.6 % — ABNORMAL HIGH (ref 11.5–15.5)
WBC Count: 4.5 10*3/uL (ref 4.0–10.5)
nRBC: 0 % (ref 0.0–0.2)

## 2022-04-17 LAB — RETICULOCYTES
Immature Retic Fract: 7.8 % (ref 2.3–15.9)
RBC.: 4.11 MIL/uL (ref 3.87–5.11)
Retic Count, Absolute: 37.8 10*3/uL (ref 19.0–186.0)
Retic Ct Pct: 0.9 % (ref 0.4–3.1)

## 2022-04-17 LAB — FERRITIN: Ferritin: 245 ng/mL (ref 11–307)

## 2022-04-17 LAB — LACTATE DEHYDROGENASE: LDH: 250 U/L — ABNORMAL HIGH (ref 98–192)

## 2022-04-17 NOTE — Progress Notes (Signed)
Hematology and Oncology Follow Up Visit  RUVI CRIVELLI [id number] 05/19/1934 88 y.o. 04/17/2022   Principle Diagnosis:  IgG Lambda MGUS Anemia of iron deficiency Anemia of erythropoietin deficiency Alpha thalassemia trait  Current Therapy:   Observation     Interim History:  Ms. Lycett is back for follow-up.  Unfortunately, her problem right now is the fact that she hurt her right wrist.  She is reaching out for some books.  Unfortunately clipboard fell on her wrist.  She went to the ER.  She did not have any fracture.  She has a brace on right now.  She is still bothered by the eczema.  I am sure that she is still seen her dermatologist for this.  She has had a stable monoclonal spike.  Pneumonia last saw her, her monoclonal spike was 0.6 g/dL.  Her IgG level was 1380 mg/dL.  The lambda light chain was 2.6 mg/dL.  She has had little bit of anemia.  I suspect that she might be iron deficient.  When we last saw her, her ferritin was 339 with an iron saturation of 25%.  She has had no change in bowel or bladder habits.  She has had no problems with nausea or vomiting.  There is been no fever.  She has had no problems with COVID.  Overall, I would say performance status is probably ECOG 1.    Medications:  Current Outpatient Medications:    Chromic Chloride POWD, See admin instructions., Disp: , Rfl:    fluticasone (FLONASE) 50 MCG/ACT nasal spray, Place into both nostrils daily., Disp: , Rfl:    meloxicam (MOBIC) 7.5 MG tablet, Take 1 tablet (7.5 mg total) by mouth daily for 10 days., Disp: 10 tablet, Rfl: 0   NON FORMULARY, alpha gaba pm, Disp: , Rfl:    NON FORMULARY, daily. Carbon 98, Disp: , Rfl:    NON FORMULARY, every 30 (thirty) days. Formula A 16 B, Disp: , Rfl:    NON FORMULARY, 50 mg at bedtime. Progesterone, Disp: , Rfl:    NON FORMULARY, 04/17/2022  Pyridoxal 1,2,3,4- Takes 3 times weekly., Disp: , Rfl:    NON FORMULARY, 2 (two) times daily. [employer] [place], Disp: ,  Rfl:    triamcinolone cream (KENALOG) 0.1 %, Apply 1 application topically 2 (two) times daily., Disp: , Rfl:   Allergies:  Allergies  Allergen Reactions   Bioflavonoids Rash   Chocolate Flavor Rash   Penicillin G Other (See Comments)   Penicillins Other (See Comments)    Unknown Reaction   Tomato (Diagnostic) Rash    Past Medical History, Surgical history, Social history, and Family History were reviewed and updated.  Review of Systems: Review of Systems  Constitutional: Negative.   HENT:  Negative.    Eyes: Negative.   Respiratory: Negative.    Cardiovascular: Negative.   Gastrointestinal: Negative.   Endocrine: Negative.   Genitourinary: Negative.    Musculoskeletal: Negative.   Skin: Negative.   Neurological: Negative.   Hematological: Negative.   Psychiatric/Behavioral: Negative.      Physical Exam:  height is 5\' 4"  (1.626 m) and weight is 118 lb 1.9 oz (53.6 kg). Her oral temperature is 97.7 F (36.5 C). Her blood pressure is 168/52 (abnormal) and her pulse is 55 (abnormal). Her respiration is 20 and oxygen saturation is 100%.   Wt Readings from Last 3 Encounters:  04/17/22 118 lb 1.9 oz (53.6 kg)  04/13/22 117 lb (53.1 kg)  02/16/22 117 lb 12.8 oz (  53.4 kg)    Physical Exam Vitals reviewed.  HENT:     Head: Normocephalic and atraumatic.  Eyes:     Pupils: Pupils are equal, round, and reactive to light.  Cardiovascular:     Rate and Rhythm: Normal rate and regular rhythm.     Heart sounds: Normal heart sounds.  Pulmonary:     Effort: Pulmonary effort is normal.     Breath sounds: Normal breath sounds.  Abdominal:     General: Bowel sounds are normal.     Palpations: Abdomen is soft.  Musculoskeletal:        General: No tenderness or deformity. Normal range of motion.     Cervical back: Normal range of motion.     Comments: She has a brace on her right wrist.  Lymphadenopathy:     Cervical: No cervical adenopathy.  Skin:    General: Skin is warm  and dry.     Findings: No erythema or rash.  Neurological:     Mental Status: She is alert and oriented to person, place, and time.  Psychiatric:        Behavior: Behavior normal.        Thought Content: Thought content normal.        Judgment: Judgment normal.      Lab Results  Component Value Date   WBC 4.5 04/17/2022   HGB 10.7 (L) 04/17/2022   HCT 33.0 (L) 04/17/2022   MCV 81.5 04/17/2022   PLT 117 (L) 04/17/2022     Chemistry      Component Value Date/Time   NA 139 04/17/2022 1500   K 3.7 04/17/2022 1500   CL 103 04/17/2022 1500   CO2 29 04/17/2022 1500   BUN 26 (H) 04/17/2022 1500   CREATININE 0.79 04/17/2022 1500      Component Value Date/Time   CALCIUM 9.6 04/17/2022 1500   ALKPHOS 59 04/17/2022 1500   AST 23 04/17/2022 1500   ALT 19 04/17/2022 1500   BILITOT 0.4 04/17/2022 1500     Impression and Plan:  Ms. Kaehler is a very nice 88 year old African-American female.  She certainly looks a lot younger.  She is incredibly accomplished.  She has been to [place].  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  Thankfully, she did not break her wrist.  Hopefully, she will recover from this.  We will see what her iron studies look like.  She may need some iron.  We will still plan to see her back in another 3 to 4 months.  Hopefully, the eczema can be taken care of.    [name], MD 3/15/20243:47 PM

## 2022-04-19 LAB — IGG, IGA, IGM
IgA: 152 mg/dL (ref 64–422)
IgG (Immunoglobin G), Serum: 1379 mg/dL (ref 586–1602)
IgM (Immunoglobulin M), Srm: 45 mg/dL (ref 26–217)

## 2022-04-20 LAB — KAPPA/LAMBDA LIGHT CHAINS
Kappa free light chain: 33.7 mg/L — ABNORMAL HIGH (ref 3.3–19.4)
Kappa, lambda light chain ratio: 1.02 (ref 0.26–1.65)
Lambda free light chains: 33.2 mg/L — ABNORMAL HIGH (ref 5.7–26.3)

## 2022-04-20 LAB — IRON AND IRON BINDING CAPACITY (CC-WL,HP ONLY)
Iron: 78 ug/dL (ref 28–170)
Saturation Ratios: 26 % (ref 10.4–31.8)
TIBC: 301 ug/dL (ref 250–450)
UIBC: 223 ug/dL (ref 148–442)

## 2022-04-28 LAB — PROTEIN ELECTROPHORESIS, SERUM, WITH REFLEX
A/G Ratio: 1.2 (ref 0.7–1.7)
Albumin ELP: 3.7 g/dL (ref 2.9–4.4)
Alpha-1-Globulin: 0.3 g/dL (ref 0.0–0.4)
Alpha-2-Globulin: 0.7 g/dL (ref 0.4–1.0)
Beta Globulin: 0.9 g/dL (ref 0.7–1.3)
Gamma Globulin: 1.1 g/dL (ref 0.4–1.8)
Globulin, Total: 3.1 g/dL (ref 2.2–3.9)
M-Spike, %: 0.5 g/dL — ABNORMAL HIGH
SPEP Interpretation: 0
Total Protein ELP: 6.8 g/dL (ref 6.0–8.5)

## 2022-04-28 LAB — IMMUNOFIXATION REFLEX, SERUM
IgA: 166 mg/dL (ref 64–422)
IgG (Immunoglobin G), Serum: 1510 mg/dL (ref 586–1602)
IgM (Immunoglobulin M), Srm: 59 mg/dL (ref 26–217)

## 2022-05-04 DIAGNOSIS — R21 Rash and other nonspecific skin eruption: Secondary | ICD-10-CM | POA: Diagnosis not present

## 2022-05-11 DIAGNOSIS — L84 Corns and callosities: Secondary | ICD-10-CM | POA: Diagnosis not present

## 2022-05-11 DIAGNOSIS — M79672 Pain in left foot: Secondary | ICD-10-CM | POA: Diagnosis not present

## 2022-05-11 DIAGNOSIS — B351 Tinea unguium: Secondary | ICD-10-CM | POA: Diagnosis not present

## 2022-05-11 DIAGNOSIS — M79671 Pain in right foot: Secondary | ICD-10-CM | POA: Diagnosis not present

## 2022-05-12 DIAGNOSIS — R21 Rash and other nonspecific skin eruption: Secondary | ICD-10-CM | POA: Diagnosis not present

## 2022-05-13 IMAGING — CR DG HIP (WITH OR WITHOUT PELVIS) 2-3V*R*
2 series · 2 of 2 positions shown · non-contrast
Comparison: None.

CLINICAL DATA: Right hip pain. Intermittent pain for years. No
known injury.

EXAM:
DG HIP (WITH OR WITHOUT PELVIS) 2-3V RIGHT

[t hip ap right]
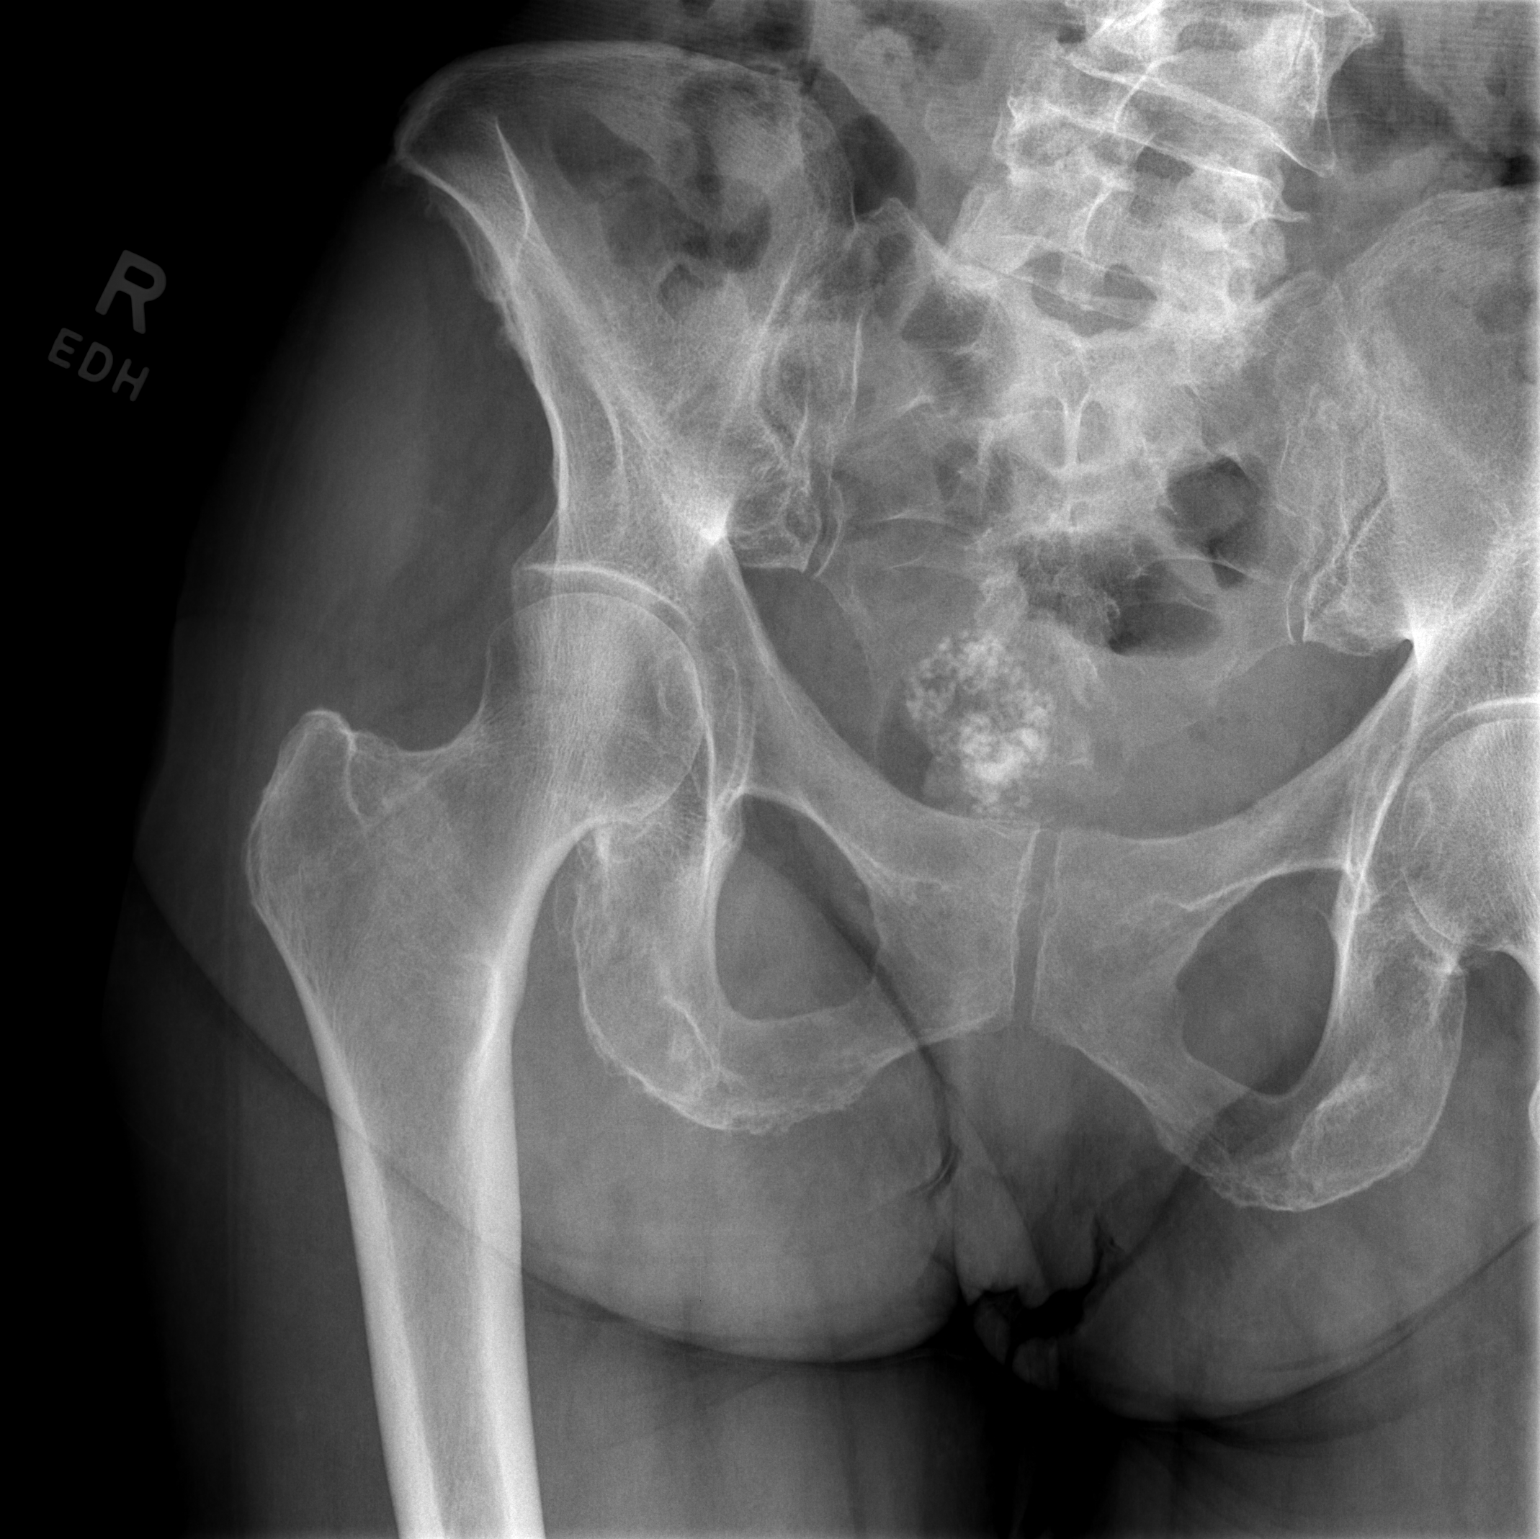

[t hip frog leg right]
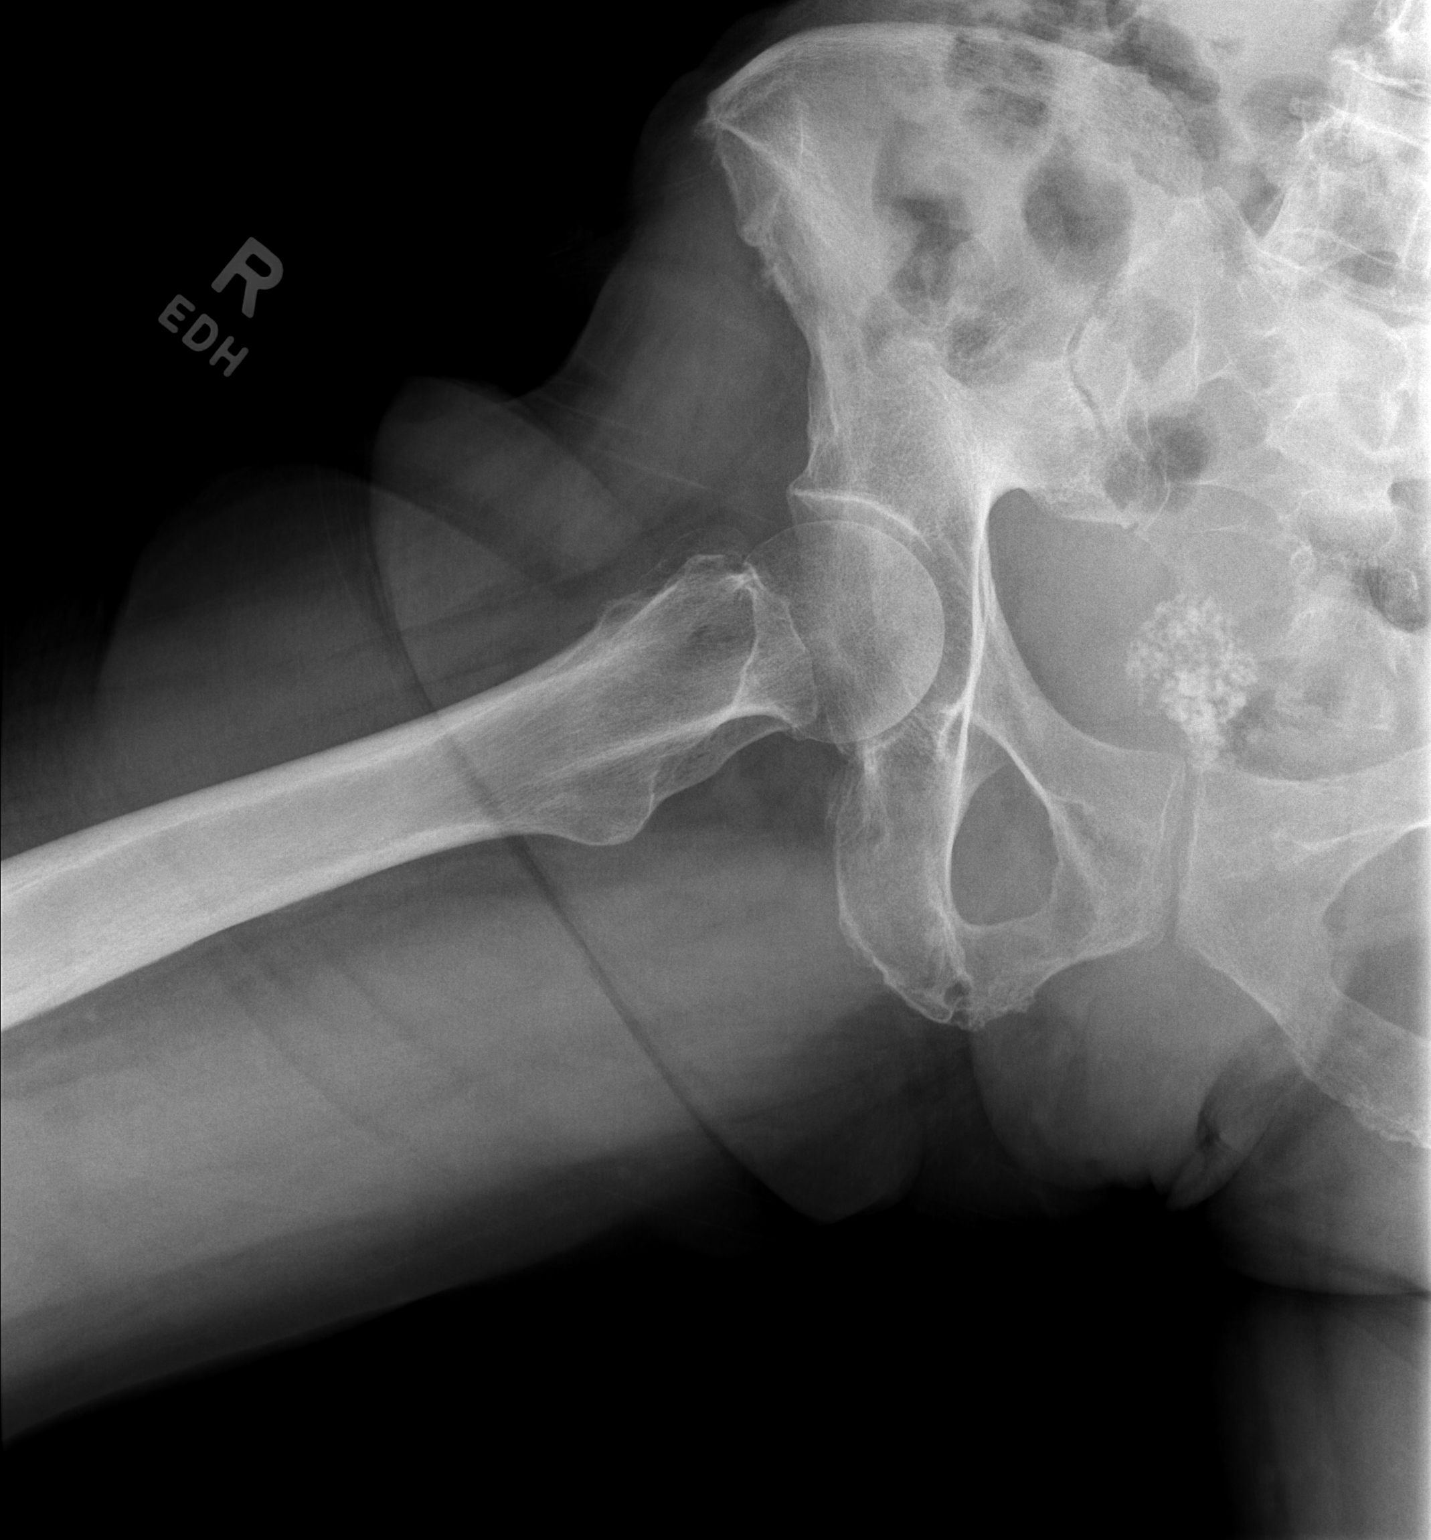

[2 of 2 positions shown; findings below may reference images not displayed]

FINDINGS: Right hip joint space is preserved. The femoral head is well seated.
No significant osteophytes. No erosion, avascular necrosis, focal
bone lesion or bony destruction. Pubic rami are intact. Pubic
symphysis is congruent. Popcorn calcification in the pelvis typical
fibroid. Soft tissues are unremarkable.
IMPRESSION: Negative radiographs of the right hip.

## 2022-05-19 DIAGNOSIS — H3530 Unspecified macular degeneration: Secondary | ICD-10-CM | POA: Diagnosis not present

## 2022-05-19 DIAGNOSIS — Z780 Asymptomatic menopausal state: Secondary | ICD-10-CM | POA: Diagnosis not present

## 2022-05-19 DIAGNOSIS — Z880 Allergy status to penicillin: Secondary | ICD-10-CM | POA: Diagnosis not present

## 2022-05-19 DIAGNOSIS — J301 Allergic rhinitis due to pollen: Secondary | ICD-10-CM | POA: Diagnosis not present

## 2022-05-19 DIAGNOSIS — Z91010 Allergy to peanuts: Secondary | ICD-10-CM | POA: Diagnosis not present

## 2022-05-19 DIAGNOSIS — Z87892 Personal history of anaphylaxis: Secondary | ICD-10-CM | POA: Diagnosis not present

## 2022-05-19 DIAGNOSIS — L309 Dermatitis, unspecified: Secondary | ICD-10-CM | POA: Diagnosis not present

## 2022-05-19 DIAGNOSIS — I10 Essential (primary) hypertension: Secondary | ICD-10-CM | POA: Diagnosis not present

## 2022-05-19 DIAGNOSIS — Z79890 Hormone replacement therapy (postmenopausal): Secondary | ICD-10-CM | POA: Diagnosis not present

## 2022-05-27 DIAGNOSIS — R21 Rash and other nonspecific skin eruption: Secondary | ICD-10-CM | POA: Diagnosis not present

## 2022-06-10 DIAGNOSIS — R21 Rash and other nonspecific skin eruption: Secondary | ICD-10-CM | POA: Diagnosis not present

## 2022-06-11 DIAGNOSIS — L2089 Other atopic dermatitis: Secondary | ICD-10-CM | POA: Diagnosis not present

## 2022-06-24 DIAGNOSIS — R21 Rash and other nonspecific skin eruption: Secondary | ICD-10-CM | POA: Diagnosis not present

## 2022-07-13 DIAGNOSIS — M79671 Pain in right foot: Secondary | ICD-10-CM | POA: Diagnosis not present

## 2022-07-13 DIAGNOSIS — M79672 Pain in left foot: Secondary | ICD-10-CM | POA: Diagnosis not present

## 2022-07-13 DIAGNOSIS — L84 Corns and callosities: Secondary | ICD-10-CM | POA: Diagnosis not present

## 2022-07-13 DIAGNOSIS — B351 Tinea unguium: Secondary | ICD-10-CM | POA: Diagnosis not present

## 2022-07-16 ENCOUNTER — Inpatient Hospital Stay: Payer: Medicare PPO

## 2022-07-16 ENCOUNTER — Ambulatory Visit: Payer: Medicare PPO | Admitting: Hematology & Oncology

## 2022-07-21 DIAGNOSIS — E559 Vitamin D deficiency, unspecified: Secondary | ICD-10-CM | POA: Diagnosis not present

## 2022-07-21 DIAGNOSIS — R5383 Other fatigue: Secondary | ICD-10-CM | POA: Diagnosis not present

## 2022-07-21 DIAGNOSIS — E7849 Other hyperlipidemia: Secondary | ICD-10-CM | POA: Diagnosis not present

## 2022-07-21 DIAGNOSIS — R7989 Other specified abnormal findings of blood chemistry: Secondary | ICD-10-CM | POA: Diagnosis not present

## 2022-07-23 ENCOUNTER — Encounter: Payer: Self-pay | Admitting: Hematology & Oncology

## 2022-07-23 ENCOUNTER — Inpatient Hospital Stay: Payer: Medicare PPO | Admitting: Hematology & Oncology

## 2022-07-23 ENCOUNTER — Other Ambulatory Visit: Payer: Self-pay

## 2022-07-23 ENCOUNTER — Inpatient Hospital Stay: Payer: Medicare PPO | Attending: Hematology & Oncology

## 2022-07-23 VITALS — BP 124/46 | HR 55 | Temp 97.9°F | Resp 16 | Ht 64.0 in | Wt 118.0 lb

## 2022-07-23 DIAGNOSIS — D472 Monoclonal gammopathy: Secondary | ICD-10-CM | POA: Diagnosis not present

## 2022-07-23 DIAGNOSIS — L309 Dermatitis, unspecified: Secondary | ICD-10-CM | POA: Insufficient documentation

## 2022-07-23 DIAGNOSIS — D509 Iron deficiency anemia, unspecified: Secondary | ICD-10-CM | POA: Insufficient documentation

## 2022-07-23 DIAGNOSIS — D563 Thalassemia minor: Secondary | ICD-10-CM | POA: Insufficient documentation

## 2022-07-23 LAB — CBC WITH DIFFERENTIAL (CANCER CENTER ONLY)
Abs Immature Granulocytes: 0.01 10*3/uL (ref 0.00–0.07)
Basophils Absolute: 0 10*3/uL (ref 0.0–0.1)
Basophils Relative: 1 %
Eosinophils Absolute: 0.2 10*3/uL (ref 0.0–0.5)
Eosinophils Relative: 5 %
HCT: 36.4 % (ref 36.0–46.0)
Hemoglobin: 11.8 g/dL — ABNORMAL LOW (ref 12.0–15.0)
Immature Granulocytes: 0 %
Lymphocytes Relative: 27 %
Lymphs Abs: 1.2 10*3/uL (ref 0.7–4.0)
MCH: 26.8 pg (ref 26.0–34.0)
MCHC: 32.4 g/dL (ref 30.0–36.0)
MCV: 82.7 fL (ref 80.0–100.0)
Monocytes Absolute: 0.5 10*3/uL (ref 0.1–1.0)
Monocytes Relative: 12 %
Neutro Abs: 2.4 10*3/uL (ref 1.7–7.7)
Neutrophils Relative %: 55 %
Platelet Count: 124 10*3/uL — ABNORMAL LOW (ref 150–400)
RBC: 4.4 MIL/uL (ref 3.87–5.11)
RDW: 16.5 % — ABNORMAL HIGH (ref 11.5–15.5)
WBC Count: 4.3 10*3/uL (ref 4.0–10.5)
nRBC: 0 % (ref 0.0–0.2)

## 2022-07-23 LAB — CMP (CANCER CENTER ONLY)
ALT: 16 U/L (ref 0–44)
AST: 21 U/L (ref 15–41)
Albumin: 4.2 g/dL (ref 3.5–5.0)
Alkaline Phosphatase: 56 U/L (ref 38–126)
Anion gap: 7 (ref 5–15)
BUN: 32 mg/dL — ABNORMAL HIGH (ref 8–23)
CO2: 31 mmol/L (ref 22–32)
Calcium: 10.2 mg/dL (ref 8.9–10.3)
Chloride: 104 mmol/L (ref 98–111)
Creatinine: 1.02 mg/dL — ABNORMAL HIGH (ref 0.44–1.00)
GFR, Estimated: 54 mL/min — ABNORMAL LOW (ref >60)
Glucose, Bld: 90 mg/dL (ref 70–99)
Potassium: 4.1 mmol/L (ref 3.5–5.1)
Sodium: 142 mmol/L (ref 135–145)
Total Bilirubin: 0.4 mg/dL (ref 0.3–1.2)
Total Protein: 7.2 g/dL (ref 6.5–8.1)

## 2022-07-23 LAB — LACTATE DEHYDROGENASE: LDH: 245 U/L — ABNORMAL HIGH (ref 98–192)

## 2022-07-23 LAB — FERRITIN: Ferritin: 221 ng/mL (ref 11–307)

## 2022-07-23 NOTE — Progress Notes (Signed)
Hematology and Oncology Follow Up Visit  Kristina Brooks [id number] October 05, 1934 88 y.o. 07/23/2022   Principle Diagnosis:  IgG Lambda MGUS Anemia of iron deficiency Anemia of erythropoietin deficiency Alpha thalassemia trait  Current Therapy:   Observation     Interim History:  [name] is back for follow-up.  Unfortunately, the issue right now is the eczema.  She now is going start on Dupixent.  She agrees had 1 dose today.  She is very bothered by the eczema.  She is little worried about some of the side effects of the Dupixent.  She has had no issues with recovery from the injury to her right wrist.  When we last saw her back in the Spring, her monoclonal spike was 0.5 g/dL.  The IgG level was 1400 mg/dL.  The Lambda light chain was 3.3 mg/dL.  Her iron studies that we did back in March showed a ferritin of 245 with an iron saturation of 26%.  She has had no problem with bowels or bladder.  She has had no issues with cough or shortness of breath.  There has been no problems with COVID.  Currently, I would say that her performance status is probably ECOG 1.     Medications:  Current Outpatient Medications:    hydrocortisone 2.5 % ointment, SMARTSIG:Topical, Disp: , Rfl:    amLODipine (NORVASC) 2.5 MG tablet, Take 2.5 mg by mouth daily., Disp: , Rfl:    Azelastine HCl 137 MCG/SPRAY SOLN, Place 2 puffs into the nose as needed., Disp: , Rfl:    Chromic Chloride POWD, See admin instructions., Disp: , Rfl:    DUPIXENT 300 MG/2ML SOPN, Inject 300 mg into the skin 2 (two) times a week., Disp: , Rfl:    fluticasone (FLONASE) 50 MCG/ACT nasal spray, Place into both nostrils daily., Disp: , Rfl:    fluticasone (FLOVENT HFA) 110 MCG/ACT inhaler, Inhale 1 puff into the lungs as needed., Disp: , Rfl:    NON FORMULARY, alpha gaba pm, Disp: , Rfl:    NON FORMULARY, daily. Carbon 98, Disp: , Rfl:    NON FORMULARY, every 30 (thirty) days. Formula A 16 B, Disp: , Rfl:    NON  FORMULARY, 50 mg at bedtime. Progesterone, Disp: , Rfl:    NON FORMULARY, 04/17/2022  Pyridoxal 1,2,3,4- Takes 3 times weekly., Disp: , Rfl:    NON FORMULARY, 2 (two) times daily. Lanney Gins [place], Disp: , Rfl:    triamcinolone cream (KENALOG) 0.1 %, Apply 1 application topically 2 (two) times daily., Disp: , Rfl:   Allergies:  Allergies  Allergen Reactions   Bioflavonoids Rash   Chocolate Flavor Rash   Penicillin G Other (See Comments)   Penicillins Other (See Comments)    Unknown Reaction   Tomato (Diagnostic) Rash    Past Medical History, Surgical history, Social history, and Family History were reviewed and updated.  Review of Systems: Review of Systems  Constitutional: Negative.   HENT:  Negative.    Eyes: Negative.   Respiratory: Negative.    Cardiovascular: Negative.   Gastrointestinal: Negative.   Endocrine: Negative.   Genitourinary: Negative.    Musculoskeletal: Negative.   Skin: Negative.   Neurological: Negative.   Hematological: Negative.   Psychiatric/Behavioral: Negative.      Physical Exam:  height is 5\' 4"  (1.626 m) and weight is 118 lb (53.5 kg). Her oral temperature is 97.9 F (36.6 C). Her blood pressure is 124/46 (abnormal) and her pulse is 55 (abnormal). Her respiration is  16 and oxygen saturation is 100%.   Wt Readings from Last 3 Encounters:  07/23/22 118 lb (53.5 kg)  04/17/22 118 lb 1.9 oz (53.6 kg)  04/13/22 117 lb (53.1 kg)    Physical Exam Vitals reviewed.  HENT:     Head: Normocephalic and atraumatic.  Eyes:     Pupils: Pupils are equal, round, and reactive to light.  Cardiovascular:     Rate and Rhythm: Normal rate and regular rhythm.     Heart sounds: Normal heart sounds.  Pulmonary:     Effort: Pulmonary effort is normal.     Breath sounds: Normal breath sounds.  Abdominal:     General: Bowel sounds are normal.     Palpations: Abdomen is soft.  Musculoskeletal:        General: No tenderness or deformity. Normal range of motion.      Cervical back: Normal range of motion.     Comments: She has a brace on her right wrist.  Lymphadenopathy:     Cervical: No cervical adenopathy.  Skin:    General: Skin is warm and dry.     Findings: No erythema or rash.  Neurological:     Mental Status: She is alert and oriented to person, place, and time.  Psychiatric:        Behavior: Behavior normal.        Thought Content: Thought content normal.        Judgment: Judgment normal.     Lab Results  Component Value Date   WBC 4.3 07/23/2022   HGB 11.8 (L) 07/23/2022   HCT 36.4 07/23/2022   MCV 82.7 07/23/2022   PLT 124 (L) 07/23/2022     Chemistry      Component Value Date/Time   NA 139 04/17/2022 1500   K 3.7 04/17/2022 1500   CL 103 04/17/2022 1500   CO2 29 04/17/2022 1500   BUN 26 (H) 04/17/2022 1500   CREATININE 0.79 04/17/2022 1500      Component Value Date/Time   CALCIUM 9.6 04/17/2022 1500   ALKPHOS 59 04/17/2022 1500   AST 23 04/17/2022 1500   ALT 19 04/17/2022 1500   BILITOT 0.4 04/17/2022 1500     Impression and Plan:  [name] is a very nice 88 year old African-American female.  She certainly looks a lot younger.  She is incredibly accomplished.  She has been to [place].  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  Hopefully, the Dupixent will help with her eczema.  We will have to see what her monoclonal studies look like.  We will see what her iron studies look like.  Because the Dupixent, I probably want to get her back a little bit sooner so we can make sure that her blood counts are not affected.  I will plan to get her back in late July.   [name], MD 6/20/20243:39 PM

## 2022-07-24 LAB — IGG, IGA, IGM
IgA: 156 mg/dL (ref 64–422)
IgG (Immunoglobin G), Serum: 1316 mg/dL (ref 586–1602)
IgM (Immunoglobulin M), Srm: 44 mg/dL (ref 26–217)

## 2022-07-24 LAB — KAPPA/LAMBDA LIGHT CHAINS
Kappa free light chain: 25.8 mg/L — ABNORMAL HIGH (ref 3.3–19.4)
Kappa, lambda light chain ratio: 0.81 (ref 0.26–1.65)
Lambda free light chains: 31.9 mg/L — ABNORMAL HIGH (ref 5.7–26.3)

## 2022-07-24 LAB — IRON AND IRON BINDING CAPACITY (CC-WL,HP ONLY)
Iron: 84 ug/dL (ref 28–170)
Saturation Ratios: 29 % (ref 10.4–31.8)
TIBC: 294 ug/dL (ref 250–450)
UIBC: 210 ug/dL (ref 148–442)

## 2022-07-27 ENCOUNTER — Telehealth: Payer: Self-pay | Admitting: *Deleted

## 2022-07-27 NOTE — Telephone Encounter (Signed)
-----   Message from [name], MD sent at 07/26/2022  8:26 PM EDT ----- Call - the protein levels ands iron are all very stable!!!  Kristina Brooks

## 2022-07-28 DIAGNOSIS — Z79899 Other long term (current) drug therapy: Secondary | ICD-10-CM | POA: Diagnosis not present

## 2022-07-28 DIAGNOSIS — H159 Unspecified disorder of sclera: Secondary | ICD-10-CM | POA: Diagnosis not present

## 2022-07-28 DIAGNOSIS — L298 Other pruritus: Secondary | ICD-10-CM | POA: Diagnosis not present

## 2022-07-28 DIAGNOSIS — L280 Lichen simplex chronicus: Secondary | ICD-10-CM | POA: Diagnosis not present

## 2022-07-28 DIAGNOSIS — L209 Atopic dermatitis, unspecified: Secondary | ICD-10-CM | POA: Diagnosis not present

## 2022-07-29 DIAGNOSIS — R21 Rash and other nonspecific skin eruption: Secondary | ICD-10-CM | POA: Diagnosis not present

## 2022-07-30 ENCOUNTER — Encounter: Payer: Self-pay | Admitting: *Deleted

## 2022-07-30 DIAGNOSIS — H11232 Symblepharon, left eye: Secondary | ICD-10-CM | POA: Diagnosis not present

## 2022-07-30 DIAGNOSIS — H11442 Conjunctival cysts, left eye: Secondary | ICD-10-CM | POA: Diagnosis not present

## 2022-07-30 LAB — PROTEIN ELECTROPHORESIS, SERUM, WITH REFLEX
A/G Ratio: 1.2 (ref 0.7–1.7)
Albumin ELP: 3.6 g/dL (ref 2.9–4.4)
Alpha-1-Globulin: 0.2 g/dL (ref 0.0–0.4)
Alpha-2-Globulin: 0.7 g/dL (ref 0.4–1.0)
Beta Globulin: 1 g/dL (ref 0.7–1.3)
Gamma Globulin: 1.1 g/dL (ref 0.4–1.8)
Globulin, Total: 3.1 g/dL (ref 2.2–3.9)
M-Spike, %: 0.6 g/dL — ABNORMAL HIGH
SPEP Interpretation: 0
Total Protein ELP: 6.7 g/dL (ref 6.0–8.5)

## 2022-07-30 LAB — IMMUNOFIXATION REFLEX, SERUM
IgA: 151 mg/dL (ref 64–422)
IgG (Immunoglobin G), Serum: 1295 mg/dL (ref 586–1602)
IgM (Immunoglobulin M), Srm: 43 mg/dL (ref 26–217)

## 2022-08-04 DIAGNOSIS — H33002 Unspecified retinal detachment with retinal break, left eye: Secondary | ICD-10-CM | POA: Diagnosis not present

## 2022-08-04 DIAGNOSIS — Z9889 Other specified postprocedural states: Secondary | ICD-10-CM | POA: Diagnosis not present

## 2022-08-05 DIAGNOSIS — T8579XA Infection and inflammatory reaction due to other internal prosthetic devices, implants and grafts, initial encounter: Secondary | ICD-10-CM | POA: Diagnosis not present

## 2022-08-05 DIAGNOSIS — H33191 Other retinoschisis and retinal cysts, right eye: Secondary | ICD-10-CM | POA: Diagnosis not present

## 2022-08-05 DIAGNOSIS — H31093 Other chorioretinal scars, bilateral: Secondary | ICD-10-CM | POA: Diagnosis not present

## 2022-08-05 DIAGNOSIS — Z9889 Other specified postprocedural states: Secondary | ICD-10-CM | POA: Diagnosis not present

## 2022-08-17 DIAGNOSIS — H9193 Unspecified hearing loss, bilateral: Secondary | ICD-10-CM | POA: Diagnosis not present

## 2022-08-17 DIAGNOSIS — Z1331 Encounter for screening for depression: Secondary | ICD-10-CM | POA: Diagnosis not present

## 2022-08-17 DIAGNOSIS — I10 Essential (primary) hypertension: Secondary | ICD-10-CM | POA: Diagnosis not present

## 2022-08-17 DIAGNOSIS — E46 Unspecified protein-calorie malnutrition: Secondary | ICD-10-CM | POA: Diagnosis not present

## 2022-08-17 DIAGNOSIS — L309 Dermatitis, unspecified: Secondary | ICD-10-CM | POA: Diagnosis not present

## 2022-08-17 DIAGNOSIS — D61818 Other pancytopenia: Secondary | ICD-10-CM | POA: Diagnosis not present

## 2022-08-17 DIAGNOSIS — D472 Monoclonal gammopathy: Secondary | ICD-10-CM | POA: Diagnosis not present

## 2022-08-17 DIAGNOSIS — Z0000 Encounter for general adult medical examination without abnormal findings: Secondary | ICD-10-CM | POA: Diagnosis not present

## 2022-08-18 ENCOUNTER — Encounter (INDEPENDENT_AMBULATORY_CARE_PROVIDER_SITE_OTHER): Payer: Medicare PPO | Admitting: Ophthalmology

## 2022-08-27 ENCOUNTER — Encounter: Payer: Self-pay | Admitting: Hematology & Oncology

## 2022-08-27 ENCOUNTER — Inpatient Hospital Stay: Payer: Medicare PPO | Admitting: Hematology & Oncology

## 2022-08-27 ENCOUNTER — Inpatient Hospital Stay: Payer: Medicare PPO | Attending: Hematology & Oncology

## 2022-08-27 ENCOUNTER — Other Ambulatory Visit: Payer: Self-pay

## 2022-08-27 VITALS — BP 138/47 | HR 58 | Temp 97.7°F | Resp 18 | Ht 64.0 in | Wt 124.0 lb

## 2022-08-27 DIAGNOSIS — D509 Iron deficiency anemia, unspecified: Secondary | ICD-10-CM | POA: Insufficient documentation

## 2022-08-27 DIAGNOSIS — L309 Dermatitis, unspecified: Secondary | ICD-10-CM | POA: Diagnosis not present

## 2022-08-27 DIAGNOSIS — D563 Thalassemia minor: Secondary | ICD-10-CM | POA: Insufficient documentation

## 2022-08-27 DIAGNOSIS — D472 Monoclonal gammopathy: Secondary | ICD-10-CM | POA: Insufficient documentation

## 2022-08-27 LAB — CBC WITH DIFFERENTIAL (CANCER CENTER ONLY)
Abs Immature Granulocytes: 0.01 10*3/uL (ref 0.00–0.07)
Basophils Absolute: 0 10*3/uL (ref 0.0–0.1)
Basophils Relative: 1 %
Eosinophils Absolute: 0.3 10*3/uL (ref 0.0–0.5)
Eosinophils Relative: 7 %
HCT: 34.2 % — ABNORMAL LOW (ref 36.0–46.0)
Hemoglobin: 10.9 g/dL — ABNORMAL LOW (ref 12.0–15.0)
Immature Granulocytes: 0 %
Lymphocytes Relative: 29 %
Lymphs Abs: 1.4 10*3/uL (ref 0.7–4.0)
MCH: 26.7 pg (ref 26.0–34.0)
MCHC: 31.9 g/dL (ref 30.0–36.0)
MCV: 83.6 fL (ref 80.0–100.0)
Monocytes Absolute: 0.5 10*3/uL (ref 0.1–1.0)
Monocytes Relative: 11 %
Neutro Abs: 2.5 10*3/uL (ref 1.7–7.7)
Neutrophils Relative %: 52 %
Platelet Count: 132 10*3/uL — ABNORMAL LOW (ref 150–400)
RBC: 4.09 MIL/uL (ref 3.87–5.11)
RDW: 16.4 % — ABNORMAL HIGH (ref 11.5–15.5)
WBC Count: 4.7 10*3/uL (ref 4.0–10.5)
nRBC: 0 % (ref 0.0–0.2)

## 2022-08-27 LAB — FERRITIN: Ferritin: 211 ng/mL (ref 11–307)

## 2022-08-27 LAB — CMP (CANCER CENTER ONLY)
ALT: 16 U/L (ref 0–44)
AST: 22 U/L (ref 15–41)
Albumin: 4.2 g/dL (ref 3.5–5.0)
Alkaline Phosphatase: 66 U/L (ref 38–126)
Anion gap: 9 (ref 5–15)
BUN: 26 mg/dL — ABNORMAL HIGH (ref 8–23)
CO2: 30 mmol/L (ref 22–32)
Calcium: 10.2 mg/dL (ref 8.9–10.3)
Chloride: 103 mmol/L (ref 98–111)
Creatinine: 0.79 mg/dL (ref 0.44–1.00)
GFR, Estimated: >60 mL/min (ref >60)
Glucose, Bld: 103 mg/dL — ABNORMAL HIGH (ref 70–99)
Potassium: 3.6 mmol/L (ref 3.5–5.1)
Sodium: 142 mmol/L (ref 135–145)
Total Bilirubin: 0.4 mg/dL (ref 0.3–1.2)
Total Protein: 7.3 g/dL (ref 6.5–8.1)

## 2022-08-27 LAB — LACTATE DEHYDROGENASE: LDH: 220 U/L — ABNORMAL HIGH (ref 98–192)

## 2022-08-27 NOTE — Progress Notes (Signed)
Hematology and Oncology Follow Up Visit  Kristina Brooks [id number] [birth year] 88 y.o. 08/27/2022   Principle Diagnosis:  IgG Lambda MGUS Anemia of iron deficiency Anemia of erythropoietin deficiency Alpha thalassemia trait  Current Therapy:   Observation     Interim History:  Kristina Brooks is back for follow-up.  We last saw her back in June.  She is doing a bit better with respect to her eczema.  She now is on the Dupixent.  She is also using a special cream.  She feels okay.  She is exercising.  She is having no problems with nausea or vomiting.  She is having no problems with bowels or bladder.  There is no cough.  When we last saw her, the monoclonal spike was 0.6 g/dL.  Her IgG level was 1300 mg/dL.  Her lambda light chain was 3.2 mg/dL.  All this is holding steady.  There has been no fever.  She has had no bleeding.  Overall, I would say that her performance status is probably ECOG 1.      Medications:  Current Outpatient Medications:    EUCRISA 2 % OINT, Apply 1 Application topically 2 (two) times daily., Disp: , Rfl:    amLODipine (NORVASC) 2.5 MG tablet, Take 2.5 mg by mouth daily., Disp: , Rfl:    Azelastine HCl 137 MCG/SPRAY SOLN, Place 2 puffs into the nose as needed., Disp: , Rfl:    Chromic Chloride POWD, See admin instructions., Disp: , Rfl:    DUPIXENT 300 MG/2ML SOPN, Inject 300 mg into the skin 2 (two) times a week., Disp: , Rfl:    fluticasone (FLONASE) 50 MCG/ACT nasal spray, Place into both nostrils daily., Disp: , Rfl:    fluticasone (FLOVENT HFA) 110 MCG/ACT inhaler, Inhale 1 puff into the lungs as needed., Disp: , Rfl:    hydrocortisone 2.5 % ointment, SMARTSIG:Topical, Disp: , Rfl:    NON FORMULARY, alpha gaba pm, Disp: , Rfl:    NON FORMULARY, daily. Carbon 98, Disp: , Rfl:    NON FORMULARY, every 30 (thirty) days. Formula A 16 B, Disp: , Rfl:    NON FORMULARY, 50 mg at bedtime. Progesterone, Disp: , Rfl:    NON FORMULARY, 04/17/2022  Pyridoxal  1,2,3,4- Takes 3 times weekly., Disp: , Rfl:    NON FORMULARY, 2 (two) times daily. Lanney Gins [place], Disp: , Rfl:    triamcinolone cream (KENALOG) 0.1 %, Apply 1 application topically 2 (two) times daily., Disp: , Rfl:   Allergies:  Allergies  Allergen Reactions   Bioflavonoids Rash   Chocolate Flavor Rash   Penicillin G Other (See Comments)   Penicillins Other (See Comments)    Unknown Reaction   Tomato (Diagnostic) Rash    Past Medical History, Surgical history, Social history, and Family History were reviewed and updated.  Review of Systems: Review of Systems  Constitutional: Negative.   HENT:  Negative.    Eyes: Negative.   Respiratory: Negative.    Cardiovascular: Negative.   Gastrointestinal: Negative.   Endocrine: Negative.   Genitourinary: Negative.    Musculoskeletal: Negative.   Skin: Negative.   Neurological: Negative.   Hematological: Negative.   Psychiatric/Behavioral: Negative.      Physical Exam:  height is 5\' 4"  (1.626 m) and weight is 124 lb (56.2 kg). Her oral temperature is 97.7 F (36.5 C). Her blood pressure is 138/47 (abnormal) and her pulse is 58 (abnormal). Her respiration is 18 and oxygen saturation is 100%.   Wt Readings from  Last 3 Encounters:  08/27/22 124 lb (56.2 kg)  07/23/22 118 lb (53.5 kg)  04/17/22 118 lb 1.9 oz (53.6 kg)    Physical Exam Vitals reviewed.  HENT:     Head: Normocephalic and atraumatic.  Eyes:     Pupils: Pupils are equal, round, and reactive to light.  Cardiovascular:     Rate and Rhythm: Normal rate and regular rhythm.     Heart sounds: Normal heart sounds.  Pulmonary:     Effort: Pulmonary effort is normal.     Breath sounds: Normal breath sounds.  Abdominal:     General: Bowel sounds are normal.     Palpations: Abdomen is soft.  Musculoskeletal:        General: No tenderness or deformity. Normal range of motion.     Cervical back: Normal range of motion.     Comments: She has a brace on her right wrist.   Lymphadenopathy:     Cervical: No cervical adenopathy.  Skin:    General: Skin is warm and dry.     Findings: No erythema or rash.  Neurological:     Mental Status: She is alert and oriented to person, place, and time.  Psychiatric:        Behavior: Behavior normal.        Thought Content: Thought content normal.        Judgment: Judgment normal.      Lab Results  Component Value Date   WBC 4.7 08/27/2022   HGB 10.9 (L) 08/27/2022   HCT 34.2 (L) 08/27/2022   MCV 83.6 08/27/2022   PLT 132 (L) 08/27/2022     Chemistry      Component Value Date/Time   NA 142 08/27/2022 1453   K 3.6 08/27/2022 1453   CL 103 08/27/2022 1453   CO2 30 08/27/2022 1453   BUN 26 (H) 08/27/2022 1453   CREATININE 0.79 08/27/2022 1453      Component Value Date/Time   CALCIUM 10.2 08/27/2022 1453   ALKPHOS 66 08/27/2022 1453   AST 22 08/27/2022 1453   ALT 16 08/27/2022 1453   BILITOT 0.4 08/27/2022 1453     Impression and Plan:  Kristina Brooks is a very nice 88 year old African-American female.  She certainly looks a lot younger.  She is incredibly accomplished.  She has been to [place].  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  For right now, we will continue to follow her along.  I would the eczema is helping a little bit.  We will see what her monoclonal studies show..  She has little bit of a anemia.  I am sure this is multifactorial.  Again, we will have to just watch this closely.  She does have the alpha thalassemia.  This could certainly be part of the cause of her anemia.  I would like to see her back now in 2 months.  Will try to get her back after Labor Day weekend.  Hopefully, the Dupixent will help with her eczema.  We will have to see what her monoclonal studies look like.  We will see what her iron studies look like.  Because the Dupixent, I probably want to get her back a little bit sooner so we can make sure that her blood counts are not affected.  I  will plan to get her back in late July.   [name], MD 7/25/20243:47 PM

## 2022-09-09 DIAGNOSIS — R21 Rash and other nonspecific skin eruption: Secondary | ICD-10-CM | POA: Diagnosis not present

## 2022-09-12 DIAGNOSIS — R21 Rash and other nonspecific skin eruption: Secondary | ICD-10-CM | POA: Diagnosis not present

## 2022-09-14 DIAGNOSIS — M79672 Pain in left foot: Secondary | ICD-10-CM | POA: Diagnosis not present

## 2022-09-14 DIAGNOSIS — B351 Tinea unguium: Secondary | ICD-10-CM | POA: Diagnosis not present

## 2022-09-14 DIAGNOSIS — L84 Corns and callosities: Secondary | ICD-10-CM | POA: Diagnosis not present

## 2022-09-14 DIAGNOSIS — M79671 Pain in right foot: Secondary | ICD-10-CM | POA: Diagnosis not present

## 2022-09-15 DIAGNOSIS — L2089 Other atopic dermatitis: Secondary | ICD-10-CM | POA: Diagnosis not present

## 2022-10-07 DIAGNOSIS — R21 Rash and other nonspecific skin eruption: Secondary | ICD-10-CM | POA: Diagnosis not present

## 2022-10-07 DIAGNOSIS — N951 Menopausal and female climacteric states: Secondary | ICD-10-CM | POA: Diagnosis not present

## 2022-10-14 ENCOUNTER — Inpatient Hospital Stay: Payer: Medicare PPO | Admitting: Hematology & Oncology

## 2022-10-14 ENCOUNTER — Encounter: Payer: Self-pay | Admitting: Hematology & Oncology

## 2022-10-14 ENCOUNTER — Inpatient Hospital Stay: Payer: Medicare PPO | Attending: Hematology & Oncology

## 2022-10-14 VITALS — BP 147/53 | HR 49 | Temp 97.7°F | Resp 20 | Ht 64.0 in | Wt 120.4 lb

## 2022-10-14 DIAGNOSIS — D509 Iron deficiency anemia, unspecified: Secondary | ICD-10-CM | POA: Diagnosis not present

## 2022-10-14 DIAGNOSIS — D472 Monoclonal gammopathy: Secondary | ICD-10-CM

## 2022-10-14 DIAGNOSIS — D563 Thalassemia minor: Secondary | ICD-10-CM | POA: Diagnosis not present

## 2022-10-14 LAB — CBC WITH DIFFERENTIAL (CANCER CENTER ONLY)
Abs Immature Granulocytes: 0.01 10*3/uL (ref 0.00–0.07)
Basophils Absolute: 0 10*3/uL (ref 0.0–0.1)
Basophils Relative: 1 %
Eosinophils Absolute: 0.3 10*3/uL (ref 0.0–0.5)
Eosinophils Relative: 8 %
HCT: 33.4 % — ABNORMAL LOW (ref 36.0–46.0)
Hemoglobin: 11 g/dL — ABNORMAL LOW (ref 12.0–15.0)
Immature Granulocytes: 0 %
Lymphocytes Relative: 41 %
Lymphs Abs: 1.6 10*3/uL (ref 0.7–4.0)
MCH: 26.8 pg (ref 26.0–34.0)
MCHC: 32.9 g/dL (ref 30.0–36.0)
MCV: 81.3 fL (ref 80.0–100.0)
Monocytes Absolute: 0.5 10*3/uL (ref 0.1–1.0)
Monocytes Relative: 12 %
Neutro Abs: 1.5 10*3/uL — ABNORMAL LOW (ref 1.7–7.7)
Neutrophils Relative %: 38 %
Platelet Count: 137 10*3/uL — ABNORMAL LOW (ref 150–400)
RBC: 4.11 MIL/uL (ref 3.87–5.11)
RDW: 16.1 % — ABNORMAL HIGH (ref 11.5–15.5)
WBC Count: 4 10*3/uL (ref 4.0–10.5)
nRBC: 0 % (ref 0.0–0.2)

## 2022-10-14 LAB — CMP (CANCER CENTER ONLY)
ALT: 14 U/L (ref 0–44)
AST: 18 U/L (ref 15–41)
Albumin: 4.2 g/dL (ref 3.5–5.0)
Alkaline Phosphatase: 63 U/L (ref 38–126)
Anion gap: 7 (ref 5–15)
BUN: 19 mg/dL (ref 8–23)
CO2: 31 mmol/L (ref 22–32)
Calcium: 10 mg/dL (ref 8.9–10.3)
Chloride: 105 mmol/L (ref 98–111)
Creatinine: 0.82 mg/dL (ref 0.44–1.00)
GFR, Estimated: >60 mL/min (ref >60)
Glucose, Bld: 92 mg/dL (ref 70–99)
Potassium: 3.9 mmol/L (ref 3.5–5.1)
Sodium: 143 mmol/L (ref 135–145)
Total Bilirubin: 0.4 mg/dL (ref 0.3–1.2)
Total Protein: 7.5 g/dL (ref 6.5–8.1)

## 2022-10-14 LAB — LACTATE DEHYDROGENASE: LDH: 208 U/L — ABNORMAL HIGH (ref 98–192)

## 2022-10-14 NOTE — Progress Notes (Signed)
Hematology and Oncology Follow Up Visit  JACKEY FORNSHELL [id number] Aug 21, 1934 88 y.o. 10/14/2022   Principle Diagnosis:  IgG Lambda MGUS Anemia of iron deficiency Anemia of erythropoietin deficiency Alpha thalassemia trait  Current Therapy:   Observation     Interim History:  Ms. Kirchberg is back for follow-up.  We last saw her back in July.  Since then, she been doing pretty well.  She is worried about the eczema.  She is on Dupixent for her eczema.  She thinks that is working.  She is happy that she can exercise.  She likes to go to the gym to exercise.  I think this is wonderful that she can do this.  This will really help with her bones.  When we last saw her, her monoclonal spike was stable at 0.6 g/dL.  The IgG level was 1346 mg/dL.  The lambda light chain was 3.8 mg/dL.Marland Kitchen  She has had no problem with infections.  She has had no problems with nausea or vomiting.  She has had no change in bowel or bladder habits.  She has had no bleeding.  She is worried about her blood sugar.  She came in early this morning to do a fasting blood sugar.  Overall, I would say performance status is probably ECOG 1.        Medications:  Current Outpatient Medications:    amLODipine (NORVASC) 2.5 MG tablet, Take 2.5 mg by mouth daily., Disp: , Rfl:    Azelastine HCl 137 MCG/SPRAY SOLN, Place 2 puffs into the nose as needed., Disp: , Rfl:    Chromic Chloride POWD, See admin instructions., Disp: , Rfl:    DUPIXENT 300 MG/2ML SOPN, Inject 300 mg into the skin 2 (two) times a week., Disp: , Rfl:    EUCRISA 2 % OINT, Apply 1 Application topically 2 (two) times daily., Disp: , Rfl:    fluticasone (FLONASE) 50 MCG/ACT nasal spray, Place into both nostrils daily., Disp: , Rfl:    fluticasone (FLOVENT HFA) 110 MCG/ACT inhaler, Inhale 1 puff into the lungs as needed., Disp: , Rfl:    hydrocortisone 2.5 % ointment, SMARTSIG:Topical, Disp: , Rfl:    NON FORMULARY, alpha gaba pm, Disp: , Rfl:    NON  FORMULARY, daily. Carbon 98, Disp: , Rfl:    NON FORMULARY, every 30 (thirty) days. Formula A 16 B, Disp: , Rfl:    NON FORMULARY, 50 mg at bedtime. Progesterone, Disp: , Rfl:    NON FORMULARY, 04/17/2022  Pyridoxal 1,2,3,4- Takes 3 times weekly., Disp: , Rfl:    NON FORMULARY, 2 (two) times daily. Lanney Gins [place], Disp: , Rfl:    triamcinolone cream (KENALOG) 0.1 %, Apply 1 application topically 2 (two) times daily., Disp: , Rfl:   Allergies:  Allergies  Allergen Reactions   Bioflavonoids Rash   Chocolate Flavor Rash   Penicillin G Other (See Comments)   Penicillins Other (See Comments)    Unknown Reaction   Tomato (Diagnostic) Rash    Past Medical History, Surgical history, Social history, and Family History were reviewed and updated.  Review of Systems: Review of Systems  Constitutional: Negative.   HENT:  Negative.    Eyes: Negative.   Respiratory: Negative.    Cardiovascular: Negative.   Gastrointestinal: Negative.   Endocrine: Negative.   Genitourinary: Negative.    Musculoskeletal: Negative.   Skin: Negative.   Neurological: Negative.   Hematological: Negative.   Psychiatric/Behavioral: Negative.      Physical Exam: Her vital  signs are temperature of 97.7.  Pulse 49.  Blood pressure 163/57.  Weight is 120 pounds.  Wt Readings from Last 3 Encounters:  08/27/22 124 lb (56.2 kg)  07/23/22 118 lb (53.5 kg)  04/17/22 118 lb 1.9 oz (53.6 kg)    Physical Exam Vitals reviewed.  HENT:     Head: Normocephalic and atraumatic.  Eyes:     Pupils: Pupils are equal, round, and reactive to light.  Cardiovascular:     Rate and Rhythm: Normal rate and regular rhythm.     Heart sounds: Normal heart sounds.  Pulmonary:     Effort: Pulmonary effort is normal.     Breath sounds: Normal breath sounds.  Abdominal:     General: Bowel sounds are normal.     Palpations: Abdomen is soft.  Musculoskeletal:        General: No tenderness or deformity. Normal range of motion.      Cervical back: Normal range of motion.     Comments: She has a brace on her right wrist.  Lymphadenopathy:     Cervical: No cervical adenopathy.  Skin:    General: Skin is warm and dry.     Findings: No erythema or rash.  Neurological:     Mental Status: She is alert and oriented to person, place, and time.  Psychiatric:        Behavior: Behavior normal.        Thought Content: Thought content normal.        Judgment: Judgment normal.      Lab Results  Component Value Date   WBC 4.0 10/14/2022   HGB 11.0 (L) 10/14/2022   HCT 33.4 (L) 10/14/2022   MCV 81.3 10/14/2022   PLT 137 (L) 10/14/2022     Chemistry      Component Value Date/Time   NA 142 08/27/2022 1453   K 3.6 08/27/2022 1453   CL 103 08/27/2022 1453   CO2 30 08/27/2022 1453   BUN 26 (H) 08/27/2022 1453   CREATININE 0.79 08/27/2022 1453      Component Value Date/Time   CALCIUM 10.2 08/27/2022 1453   ALKPHOS 66 08/27/2022 1453   AST 22 08/27/2022 1453   ALT 16 08/27/2022 1453   BILITOT 0.4 08/27/2022 1453     Impression and Plan:  Ms. Mayne is a very nice 88 year old African-American female.  She certainly looks a lot younger.  She is incredibly accomplished.  She has been to [place].  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  From my point of view, everything is holding pretty steady.  I do not see any problem with the monoclonal spike.  We will see what the numbers look like this time.  I know she is worried about her blood sugar.  This morning, her blood sugar was over 92.  I forgot to mention that she does have alpha thalassemia.  She is on folic acid.  Of note, her last iron studies showed a ferritin of 211 with an iron saturation of 23%.  Will plan to get her back in 3 months.  Will get her back around the Holiday season.  I want to make sure that we keep monitoring her blood so that her blood counts will not change much and that she will be able to enjoy the  holidays. Marland Kitchen   [name], MD 9/11/20248:05 AM

## 2022-10-14 NOTE — Progress Notes (Signed)
BP remains elevated, did not take BP meds at home today, was fasting for lab work. Instructed to monitor at home and notify PCP if it remains over 140/90. Verbalized understanding.

## 2022-10-15 ENCOUNTER — Inpatient Hospital Stay: Payer: Medicare PPO

## 2022-10-15 ENCOUNTER — Ambulatory Visit: Payer: Medicare PPO | Admitting: Hematology & Oncology

## 2022-10-15 LAB — IGG, IGA, IGM
IgA: 162 mg/dL (ref 64–422)
IgG (Immunoglobin G), Serum: 1423 mg/dL (ref 586–1602)
IgM (Immunoglobulin M), Srm: 52 mg/dL (ref 26–217)

## 2022-10-15 LAB — KAPPA/LAMBDA LIGHT CHAINS
Kappa free light chain: 30.6 mg/L — ABNORMAL HIGH (ref 3.3–19.4)
Kappa, lambda light chain ratio: 0.9 (ref 0.26–1.65)
Lambda free light chains: 34.1 mg/L — ABNORMAL HIGH (ref 5.7–26.3)

## 2022-10-19 DIAGNOSIS — H903 Sensorineural hearing loss, bilateral: Secondary | ICD-10-CM | POA: Diagnosis not present

## 2022-10-21 DIAGNOSIS — N951 Menopausal and female climacteric states: Secondary | ICD-10-CM | POA: Diagnosis not present

## 2022-10-21 DIAGNOSIS — R21 Rash and other nonspecific skin eruption: Secondary | ICD-10-CM | POA: Diagnosis not present

## 2022-10-23 LAB — IMMUNOFIXATION REFLEX, SERUM
IgA: 173 mg/dL (ref 64–422)
IgG (Immunoglobin G), Serum: 1550 mg/dL (ref 586–1602)
IgM (Immunoglobulin M), Srm: 51 mg/dL (ref 26–217)

## 2022-10-23 LAB — PROTEIN ELECTROPHORESIS, SERUM, WITH REFLEX
A/G Ratio: 1 (ref 0.7–1.7)
Albumin ELP: 3.6 g/dL (ref 2.9–4.4)
Alpha-1-Globulin: 0.3 g/dL (ref 0.0–0.4)
Alpha-2-Globulin: 0.9 g/dL (ref 0.4–1.0)
Beta Globulin: 1 g/dL (ref 0.7–1.3)
Gamma Globulin: 1.3 g/dL (ref 0.4–1.8)
Globulin, Total: 3.5 g/dL (ref 2.2–3.9)
M-Spike, %: 0.6 g/dL — ABNORMAL HIGH
SPEP Interpretation: 0
Total Protein ELP: 7.1 g/dL (ref 6.0–8.5)

## 2022-10-29 DIAGNOSIS — J3089 Other allergic rhinitis: Secondary | ICD-10-CM | POA: Diagnosis not present

## 2022-10-29 DIAGNOSIS — J3081 Allergic rhinitis due to animal (cat) (dog) hair and dander: Secondary | ICD-10-CM | POA: Diagnosis not present

## 2022-10-29 DIAGNOSIS — J301 Allergic rhinitis due to pollen: Secondary | ICD-10-CM | POA: Diagnosis not present

## 2022-10-29 DIAGNOSIS — N951 Menopausal and female climacteric states: Secondary | ICD-10-CM | POA: Diagnosis not present

## 2022-10-29 DIAGNOSIS — R21 Rash and other nonspecific skin eruption: Secondary | ICD-10-CM | POA: Diagnosis not present

## 2022-11-09 DIAGNOSIS — Z1231 Encounter for screening mammogram for malignant neoplasm of breast: Secondary | ICD-10-CM | POA: Diagnosis not present

## 2022-11-13 DIAGNOSIS — R21 Rash and other nonspecific skin eruption: Secondary | ICD-10-CM | POA: Diagnosis not present

## 2022-11-16 DIAGNOSIS — M79672 Pain in left foot: Secondary | ICD-10-CM | POA: Diagnosis not present

## 2022-11-16 DIAGNOSIS — M79671 Pain in right foot: Secondary | ICD-10-CM | POA: Diagnosis not present

## 2022-11-16 DIAGNOSIS — B351 Tinea unguium: Secondary | ICD-10-CM | POA: Diagnosis not present

## 2022-11-16 DIAGNOSIS — L84 Corns and callosities: Secondary | ICD-10-CM | POA: Diagnosis not present

## 2022-11-17 DIAGNOSIS — R21 Rash and other nonspecific skin eruption: Secondary | ICD-10-CM | POA: Diagnosis not present

## 2022-12-06 DIAGNOSIS — R21 Rash and other nonspecific skin eruption: Secondary | ICD-10-CM | POA: Diagnosis not present

## 2022-12-15 DIAGNOSIS — R2689 Other abnormalities of gait and mobility: Secondary | ICD-10-CM | POA: Diagnosis not present

## 2022-12-15 DIAGNOSIS — R278 Other lack of coordination: Secondary | ICD-10-CM | POA: Diagnosis not present

## 2022-12-15 DIAGNOSIS — M62561 Muscle wasting and atrophy, not elsewhere classified, right lower leg: Secondary | ICD-10-CM | POA: Diagnosis not present

## 2022-12-15 DIAGNOSIS — M62551 Muscle wasting and atrophy, not elsewhere classified, right thigh: Secondary | ICD-10-CM | POA: Diagnosis not present

## 2022-12-15 DIAGNOSIS — M25561 Pain in right knee: Secondary | ICD-10-CM | POA: Diagnosis not present

## 2022-12-16 DIAGNOSIS — R21 Rash and other nonspecific skin eruption: Secondary | ICD-10-CM | POA: Diagnosis not present

## 2022-12-17 DIAGNOSIS — L2089 Other atopic dermatitis: Secondary | ICD-10-CM | POA: Diagnosis not present

## 2022-12-21 DIAGNOSIS — R21 Rash and other nonspecific skin eruption: Secondary | ICD-10-CM | POA: Diagnosis not present

## 2022-12-22 DIAGNOSIS — R2689 Other abnormalities of gait and mobility: Secondary | ICD-10-CM | POA: Diagnosis not present

## 2022-12-22 DIAGNOSIS — M62551 Muscle wasting and atrophy, not elsewhere classified, right thigh: Secondary | ICD-10-CM | POA: Diagnosis not present

## 2022-12-22 DIAGNOSIS — M62561 Muscle wasting and atrophy, not elsewhere classified, right lower leg: Secondary | ICD-10-CM | POA: Diagnosis not present

## 2022-12-22 DIAGNOSIS — R278 Other lack of coordination: Secondary | ICD-10-CM | POA: Diagnosis not present

## 2022-12-22 DIAGNOSIS — M25561 Pain in right knee: Secondary | ICD-10-CM | POA: Diagnosis not present

## 2022-12-25 DIAGNOSIS — M62551 Muscle wasting and atrophy, not elsewhere classified, right thigh: Secondary | ICD-10-CM | POA: Diagnosis not present

## 2022-12-25 DIAGNOSIS — M25561 Pain in right knee: Secondary | ICD-10-CM | POA: Diagnosis not present

## 2022-12-25 DIAGNOSIS — R278 Other lack of coordination: Secondary | ICD-10-CM | POA: Diagnosis not present

## 2022-12-25 DIAGNOSIS — R2689 Other abnormalities of gait and mobility: Secondary | ICD-10-CM | POA: Diagnosis not present

## 2022-12-25 DIAGNOSIS — M62561 Muscle wasting and atrophy, not elsewhere classified, right lower leg: Secondary | ICD-10-CM | POA: Diagnosis not present

## 2022-12-29 DIAGNOSIS — M62561 Muscle wasting and atrophy, not elsewhere classified, right lower leg: Secondary | ICD-10-CM | POA: Diagnosis not present

## 2022-12-29 DIAGNOSIS — M25561 Pain in right knee: Secondary | ICD-10-CM | POA: Diagnosis not present

## 2022-12-29 DIAGNOSIS — M62551 Muscle wasting and atrophy, not elsewhere classified, right thigh: Secondary | ICD-10-CM | POA: Diagnosis not present

## 2022-12-29 DIAGNOSIS — R278 Other lack of coordination: Secondary | ICD-10-CM | POA: Diagnosis not present

## 2022-12-29 DIAGNOSIS — R2689 Other abnormalities of gait and mobility: Secondary | ICD-10-CM | POA: Diagnosis not present

## 2023-01-03 DIAGNOSIS — R21 Rash and other nonspecific skin eruption: Secondary | ICD-10-CM | POA: Diagnosis not present

## 2023-01-05 ENCOUNTER — Telehealth: Payer: Self-pay | Admitting: *Deleted

## 2023-01-05 DIAGNOSIS — M25561 Pain in right knee: Secondary | ICD-10-CM | POA: Diagnosis not present

## 2023-01-05 DIAGNOSIS — R278 Other lack of coordination: Secondary | ICD-10-CM | POA: Diagnosis not present

## 2023-01-05 DIAGNOSIS — M62561 Muscle wasting and atrophy, not elsewhere classified, right lower leg: Secondary | ICD-10-CM | POA: Diagnosis not present

## 2023-01-05 DIAGNOSIS — M62551 Muscle wasting and atrophy, not elsewhere classified, right thigh: Secondary | ICD-10-CM | POA: Diagnosis not present

## 2023-01-05 DIAGNOSIS — R2689 Other abnormalities of gait and mobility: Secondary | ICD-10-CM | POA: Diagnosis not present

## 2023-01-05 NOTE — Telephone Encounter (Signed)
Received laboratory orders from Dr Becky Augusta MD Fasting for HgA1C, Fasting cortisol (not timed) and ANA screen with reflex to titer.  Lab orders given to [facility] in lab.

## 2023-01-12 ENCOUNTER — Encounter: Payer: Self-pay | Admitting: Hematology & Oncology

## 2023-01-12 ENCOUNTER — Inpatient Hospital Stay: Payer: Medicare PPO | Admitting: Hematology & Oncology

## 2023-01-12 ENCOUNTER — Other Ambulatory Visit: Payer: Self-pay

## 2023-01-12 ENCOUNTER — Inpatient Hospital Stay: Payer: Medicare PPO | Attending: Hematology & Oncology

## 2023-01-12 VITALS — BP 160/54 | HR 51 | Temp 98.8°F | Resp 16 | Ht 64.0 in | Wt 121.0 lb

## 2023-01-12 DIAGNOSIS — D472 Monoclonal gammopathy: Secondary | ICD-10-CM | POA: Insufficient documentation

## 2023-01-12 DIAGNOSIS — R2689 Other abnormalities of gait and mobility: Secondary | ICD-10-CM | POA: Diagnosis not present

## 2023-01-12 DIAGNOSIS — Z79899 Other long term (current) drug therapy: Secondary | ICD-10-CM | POA: Insufficient documentation

## 2023-01-12 DIAGNOSIS — L932 Other local lupus erythematosus: Secondary | ICD-10-CM | POA: Diagnosis not present

## 2023-01-12 DIAGNOSIS — D509 Iron deficiency anemia, unspecified: Secondary | ICD-10-CM | POA: Insufficient documentation

## 2023-01-12 DIAGNOSIS — D563 Thalassemia minor: Secondary | ICD-10-CM | POA: Diagnosis not present

## 2023-01-12 DIAGNOSIS — M25561 Pain in right knee: Secondary | ICD-10-CM | POA: Diagnosis not present

## 2023-01-12 DIAGNOSIS — R5383 Other fatigue: Secondary | ICD-10-CM | POA: Diagnosis not present

## 2023-01-12 DIAGNOSIS — M62561 Muscle wasting and atrophy, not elsewhere classified, right lower leg: Secondary | ICD-10-CM | POA: Diagnosis not present

## 2023-01-12 DIAGNOSIS — L309 Dermatitis, unspecified: Secondary | ICD-10-CM | POA: Insufficient documentation

## 2023-01-12 DIAGNOSIS — R7989 Other specified abnormal findings of blood chemistry: Secondary | ICD-10-CM | POA: Diagnosis not present

## 2023-01-12 DIAGNOSIS — M62551 Muscle wasting and atrophy, not elsewhere classified, right thigh: Secondary | ICD-10-CM | POA: Diagnosis not present

## 2023-01-12 DIAGNOSIS — R278 Other lack of coordination: Secondary | ICD-10-CM | POA: Diagnosis not present

## 2023-01-12 LAB — CBC WITH DIFFERENTIAL (CANCER CENTER ONLY)
Abs Immature Granulocytes: 0.01 10*3/uL (ref 0.00–0.07)
Basophils Absolute: 0 10*3/uL (ref 0.0–0.1)
Basophils Relative: 0 %
Eosinophils Absolute: 0.4 10*3/uL (ref 0.0–0.5)
Eosinophils Relative: 9 %
HCT: 33.2 % — ABNORMAL LOW (ref 36.0–46.0)
Hemoglobin: 11 g/dL — ABNORMAL LOW (ref 12.0–15.0)
Immature Granulocytes: 0 %
Lymphocytes Relative: 38 %
Lymphs Abs: 1.5 10*3/uL (ref 0.7–4.0)
MCH: 26.7 pg (ref 26.0–34.0)
MCHC: 33.1 g/dL (ref 30.0–36.0)
MCV: 80.6 fL (ref 80.0–100.0)
Monocytes Absolute: 0.4 10*3/uL (ref 0.1–1.0)
Monocytes Relative: 11 %
Neutro Abs: 1.6 10*3/uL — ABNORMAL LOW (ref 1.7–7.7)
Neutrophils Relative %: 42 %
Platelet Count: 113 10*3/uL — ABNORMAL LOW (ref 150–400)
RBC: 4.12 MIL/uL (ref 3.87–5.11)
RDW: 16.8 % — ABNORMAL HIGH (ref 11.5–15.5)
WBC Count: 3.9 10*3/uL — ABNORMAL LOW (ref 4.0–10.5)
nRBC: 0 % (ref 0.0–0.2)

## 2023-01-12 LAB — CMP (CANCER CENTER ONLY)
ALT: 14 U/L (ref 0–44)
AST: 20 U/L (ref 15–41)
Albumin: 4.1 g/dL (ref 3.5–5.0)
Alkaline Phosphatase: 63 U/L (ref 38–126)
Anion gap: 8 (ref 5–15)
BUN: 24 mg/dL — ABNORMAL HIGH (ref 8–23)
CO2: 30 mmol/L (ref 22–32)
Calcium: 9.9 mg/dL (ref 8.9–10.3)
Chloride: 105 mmol/L (ref 98–111)
Creatinine: 0.92 mg/dL (ref 0.44–1.00)
GFR, Estimated: >60 mL/min (ref >60)
Glucose, Bld: 90 mg/dL (ref 70–99)
Potassium: 3.8 mmol/L (ref 3.5–5.1)
Sodium: 143 mmol/L (ref 135–145)
Total Bilirubin: 0.3 mg/dL (ref <1.2)
Total Protein: 7.3 g/dL (ref 6.5–8.1)

## 2023-01-12 LAB — IRON AND IRON BINDING CAPACITY (CC-WL,HP ONLY)
Iron: 59 ug/dL (ref 28–170)
Saturation Ratios: 21 % (ref 10.4–31.8)
TIBC: 281 ug/dL (ref 250–450)
UIBC: 222 ug/dL (ref 148–442)

## 2023-01-12 LAB — RETICULOCYTES
Immature Retic Fract: 12.1 % (ref 2.3–15.9)
RBC.: 4.1 MIL/uL (ref 3.87–5.11)
Retic Count, Absolute: 30.8 10*3/uL (ref 19.0–186.0)
Retic Ct Pct: 0.8 % (ref 0.4–3.1)

## 2023-01-12 LAB — FERRITIN: Ferritin: 222 ng/mL (ref 11–307)

## 2023-01-12 LAB — LACTATE DEHYDROGENASE: LDH: 187 U/L (ref 98–192)

## 2023-01-12 NOTE — Progress Notes (Signed)
Hematology and Oncology Follow Up Visit  Kristina Brooks [id number] Aug 06, 1934 88 y.o. 01/12/2023   Principle Diagnosis:  IgG Lambda MGUS Anemia of iron deficiency Anemia of erythropoietin deficiency Alpha thalassemia trait  Current Therapy:   Observation     Interim History:  Ms. Kristina Brooks is back for follow-up.  So far, everything is going pretty well for her.  She does have little bit of inflammation of the right knee.  She is doing some physical therapy for this.  Otherwise, she is doing okay.  She is dealing with the eczema.  She does take Dupixent for the eczema.  She has been followed for monoclonal gammopathy.  When we last saw her, the M spike was 0.6 g/dL.  Her IgG level was 1480 mg/dL.  The lambda light chain was we last saw her back in July.  Since then, she been doing pretty well.  She is worried about the eczema.  She is on Dupixent for her eczema.  She thinks that is working.  She is happy that she can exercise.  She likes to go to the gym to exercise.  I think this is wonderful that she can do this.  This will really help with her bones.  When we last saw her, her monoclonal spike was stable at 0.6 g/dL.  The IgG level was 1346 mg/dL.  The lambda light chain was 3.4 mg/dL.  She has had no fever.  There is been no problems with COVID.Marland Kitchen  She has had no problems with bowels or bladder.  She has had no rashes.  She has had no bleeding.  Overall, I would say performance status is probably ECOG 1.         Medications:  Current Outpatient Medications:    amLODipine (NORVASC) 2.5 MG tablet, Take 2.5 mg by mouth daily., Disp: , Rfl:    Azelastine HCl 137 MCG/SPRAY SOLN, Place 2 puffs into the nose as needed. (Patient not taking: Reported on 10/14/2022), Disp: , Rfl:    Chromic Chloride POWD, See admin instructions., Disp: , Rfl:    DUPIXENT 300 MG/2ML SOPN, Inject 300 mg into the skin every 14 (fourteen) days., Disp: , Rfl:    EUCRISA 2 % OINT, Apply 1 Application  topically 2 (two) times daily., Disp: , Rfl:    fluticasone (FLONASE) 50 MCG/ACT nasal spray, Place into both nostrils daily., Disp: , Rfl:    NON FORMULARY, alpha gaba pm (Patient not taking: Reported on 10/14/2022), Disp: , Rfl:    NON FORMULARY, daily. Carbon 98, Disp: , Rfl:    NON FORMULARY, every 30 (thirty) days. Formula A 16 B, Disp: , Rfl:    NON FORMULARY, 50 mg at bedtime. Progesterone, Disp: , Rfl:    NON FORMULARY, 04/17/2022  Pyridoxal 1,2,3,4- Takes 3 times weekly., Disp: , Rfl:    NON FORMULARY, 2 (two) times daily. Lanney Gins [place], Disp: , Rfl:   Allergies:  Allergies  Allergen Reactions   Bioflavonoids Rash   Chocolate Flavoring Agent (Non-Screening) Rash   Penicillin G Other (See Comments)   Penicillins Other (See Comments)    Unknown Reaction   Tomato (Diagnostic) Rash    Past Medical History, Surgical history, Social history, and Family History were reviewed and updated.  Review of Systems: Review of Systems  Constitutional: Negative.   HENT:  Negative.    Eyes: Negative.   Respiratory: Negative.    Cardiovascular: Negative.   Gastrointestinal: Negative.   Endocrine: Negative.   Genitourinary: Negative.  Musculoskeletal: Negative.   Skin: Negative.   Neurological: Negative.   Hematological: Negative.   Psychiatric/Behavioral: Negative.      Physical Exam: Her vital signs are temperature of 98.8.  Pulse 51.  Blood pressure 160/54.  Weight is 121 pounds.    Wt Readings from Last 3 Encounters:  10/14/22 120 lb 6.4 oz (54.6 kg)  08/27/22 124 lb (56.2 kg)  07/23/22 118 lb (53.5 kg)    Physical Exam Vitals reviewed.  HENT:     Head: Normocephalic and atraumatic.  Eyes:     Pupils: Pupils are equal, round, and reactive to light.  Cardiovascular:     Rate and Rhythm: Normal rate and regular rhythm.     Heart sounds: Normal heart sounds.  Pulmonary:     Effort: Pulmonary effort is normal.     Breath sounds: Normal breath sounds.  Abdominal:      General: Bowel sounds are normal.     Palpations: Abdomen is soft.  Musculoskeletal:        General: No tenderness or deformity. Normal range of motion.     Cervical back: Normal range of motion.     Comments: She has a brace on her right wrist.  Lymphadenopathy:     Cervical: No cervical adenopathy.  Skin:    General: Skin is warm and dry.     Findings: No erythema or rash.  Neurological:     Mental Status: She is alert and oriented to person, place, and time.  Psychiatric:        Behavior: Behavior normal.        Thought Content: Thought content normal.        Judgment: Judgment normal.     Lab Results  Component Value Date   WBC 3.9 (L) 01/12/2023   HGB 11.0 (L) 01/12/2023   HCT 33.2 (L) 01/12/2023   MCV 80.6 01/12/2023   PLT 113 (L) 01/12/2023     Chemistry      Component Value Date/Time   NA 143 10/14/2022 0754   K 3.9 10/14/2022 0754   CL 105 10/14/2022 0754   CO2 31 10/14/2022 0754   BUN 19 10/14/2022 0754   CREATININE 0.82 10/14/2022 0754      Component Value Date/Time   CALCIUM 10.0 10/14/2022 0754   ALKPHOS 63 10/14/2022 0754   AST 18 10/14/2022 0754   ALT 14 10/14/2022 0754   BILITOT 0.4 10/14/2022 0754     Impression and Plan:  Ms. Kristina Brooks is a very nice 88 year old African-American female.  She certainly looks a lot younger.  She is incredibly accomplished.  She has been to [place].  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  From my point of view, everything is holding pretty steady.  I do not see any problem with the monoclonal spike.  We will see what the numbers look like this time.  Hopefully, she will do well with her physical therapy for the right knee.  We will go ahead and plan to get her back in 3 months.  From my point of view, I do not see any problems with her being on the Dupixent for her eczema.  Her blood counts are doing okay.  Marland Kitchen   [name], MD 12/10/20248:41 AM

## 2023-01-13 DIAGNOSIS — R21 Rash and other nonspecific skin eruption: Secondary | ICD-10-CM | POA: Diagnosis not present

## 2023-01-13 LAB — IGG, IGA, IGM
IgA: 144 mg/dL (ref 64–422)
IgG (Immunoglobin G), Serum: 1365 mg/dL (ref 586–1602)
IgM (Immunoglobulin M), Srm: 42 mg/dL (ref 26–217)

## 2023-01-13 LAB — KAPPA/LAMBDA LIGHT CHAINS
Kappa free light chain: 28 mg/L — ABNORMAL HIGH (ref 3.3–19.4)
Kappa, lambda light chain ratio: 0.78 (ref 0.26–1.65)
Lambda free light chains: 36 mg/L — ABNORMAL HIGH (ref 5.7–26.3)

## 2023-01-15 DIAGNOSIS — R2689 Other abnormalities of gait and mobility: Secondary | ICD-10-CM | POA: Diagnosis not present

## 2023-01-15 DIAGNOSIS — M25561 Pain in right knee: Secondary | ICD-10-CM | POA: Diagnosis not present

## 2023-01-15 DIAGNOSIS — M62551 Muscle wasting and atrophy, not elsewhere classified, right thigh: Secondary | ICD-10-CM | POA: Diagnosis not present

## 2023-01-15 DIAGNOSIS — R278 Other lack of coordination: Secondary | ICD-10-CM | POA: Diagnosis not present

## 2023-01-15 DIAGNOSIS — M62561 Muscle wasting and atrophy, not elsewhere classified, right lower leg: Secondary | ICD-10-CM | POA: Diagnosis not present

## 2023-01-18 DIAGNOSIS — M79671 Pain in right foot: Secondary | ICD-10-CM | POA: Diagnosis not present

## 2023-01-18 DIAGNOSIS — B351 Tinea unguium: Secondary | ICD-10-CM | POA: Diagnosis not present

## 2023-01-18 DIAGNOSIS — M79672 Pain in left foot: Secondary | ICD-10-CM | POA: Diagnosis not present

## 2023-01-19 DIAGNOSIS — R2689 Other abnormalities of gait and mobility: Secondary | ICD-10-CM | POA: Diagnosis not present

## 2023-01-19 DIAGNOSIS — M62561 Muscle wasting and atrophy, not elsewhere classified, right lower leg: Secondary | ICD-10-CM | POA: Diagnosis not present

## 2023-01-19 DIAGNOSIS — R278 Other lack of coordination: Secondary | ICD-10-CM | POA: Diagnosis not present

## 2023-01-19 DIAGNOSIS — M62551 Muscle wasting and atrophy, not elsewhere classified, right thigh: Secondary | ICD-10-CM | POA: Diagnosis not present

## 2023-01-19 DIAGNOSIS — M25561 Pain in right knee: Secondary | ICD-10-CM | POA: Diagnosis not present

## 2023-01-22 ENCOUNTER — Inpatient Hospital Stay: Payer: Medicare PPO

## 2023-01-22 DIAGNOSIS — Z79899 Other long term (current) drug therapy: Secondary | ICD-10-CM | POA: Diagnosis not present

## 2023-01-22 DIAGNOSIS — M25561 Pain in right knee: Secondary | ICD-10-CM | POA: Diagnosis not present

## 2023-01-22 DIAGNOSIS — M62561 Muscle wasting and atrophy, not elsewhere classified, right lower leg: Secondary | ICD-10-CM | POA: Diagnosis not present

## 2023-01-22 DIAGNOSIS — D472 Monoclonal gammopathy: Secondary | ICD-10-CM

## 2023-01-22 DIAGNOSIS — D563 Thalassemia minor: Secondary | ICD-10-CM | POA: Diagnosis not present

## 2023-01-22 DIAGNOSIS — M62551 Muscle wasting and atrophy, not elsewhere classified, right thigh: Secondary | ICD-10-CM | POA: Diagnosis not present

## 2023-01-22 DIAGNOSIS — R278 Other lack of coordination: Secondary | ICD-10-CM | POA: Diagnosis not present

## 2023-01-22 DIAGNOSIS — R2689 Other abnormalities of gait and mobility: Secondary | ICD-10-CM | POA: Diagnosis not present

## 2023-01-22 DIAGNOSIS — L309 Dermatitis, unspecified: Secondary | ICD-10-CM | POA: Diagnosis not present

## 2023-01-22 DIAGNOSIS — D509 Iron deficiency anemia, unspecified: Secondary | ICD-10-CM | POA: Diagnosis not present

## 2023-01-22 LAB — PROTEIN ELECTROPHORESIS, SERUM, WITH REFLEX
A/G Ratio: 1.3 (ref 0.7–1.7)
Albumin ELP: 3.8 g/dL (ref 2.9–4.4)
Alpha-1-Globulin: 0.2 g/dL (ref 0.0–0.4)
Alpha-2-Globulin: 0.8 g/dL (ref 0.4–1.0)
Beta Globulin: 0.9 g/dL (ref 0.7–1.3)
Gamma Globulin: 1.1 g/dL (ref 0.4–1.8)
Globulin, Total: 3 g/dL (ref 2.2–3.9)
M-Spike, %: 0.6 g/dL — ABNORMAL HIGH
SPEP Interpretation: 0
Total Protein ELP: 6.8 g/dL (ref 6.0–8.5)

## 2023-01-22 LAB — HEMOGLOBIN A1C
Hgb A1c MFr Bld: 5.8 % — ABNORMAL HIGH (ref 4.8–5.6)
Mean Plasma Glucose: 119.76 mg/dL

## 2023-01-22 LAB — CORTISOL: Cortisol, Plasma: 19.1 ug/dL

## 2023-01-22 LAB — IMMUNOFIXATION REFLEX, SERUM
IgA: 146 mg/dL (ref 64–422)
IgG (Immunoglobin G), Serum: 1364 mg/dL (ref 586–1602)
IgM (Immunoglobulin M), Srm: 44 mg/dL (ref 26–217)

## 2023-01-26 LAB — ANTINUCLEAR ANTIBODIES, IFA: ANA Ab, IFA: NEGATIVE

## 2023-01-29 DIAGNOSIS — R278 Other lack of coordination: Secondary | ICD-10-CM | POA: Diagnosis not present

## 2023-01-29 DIAGNOSIS — M62561 Muscle wasting and atrophy, not elsewhere classified, right lower leg: Secondary | ICD-10-CM | POA: Diagnosis not present

## 2023-01-29 DIAGNOSIS — M62551 Muscle wasting and atrophy, not elsewhere classified, right thigh: Secondary | ICD-10-CM | POA: Diagnosis not present

## 2023-01-29 DIAGNOSIS — R2689 Other abnormalities of gait and mobility: Secondary | ICD-10-CM | POA: Diagnosis not present

## 2023-01-29 DIAGNOSIS — M25561 Pain in right knee: Secondary | ICD-10-CM | POA: Diagnosis not present

## 2023-02-01 DIAGNOSIS — Z9889 Other specified postprocedural states: Secondary | ICD-10-CM | POA: Diagnosis not present

## 2023-02-01 DIAGNOSIS — H33191 Other retinoschisis and retinal cysts, right eye: Secondary | ICD-10-CM | POA: Diagnosis not present

## 2023-02-01 DIAGNOSIS — T8579XA Infection and inflammatory reaction due to other internal prosthetic devices, implants and grafts, initial encounter: Secondary | ICD-10-CM | POA: Diagnosis not present

## 2023-02-01 DIAGNOSIS — H31093 Other chorioretinal scars, bilateral: Secondary | ICD-10-CM | POA: Diagnosis not present

## 2023-02-02 DIAGNOSIS — R278 Other lack of coordination: Secondary | ICD-10-CM | POA: Diagnosis not present

## 2023-02-02 DIAGNOSIS — M62561 Muscle wasting and atrophy, not elsewhere classified, right lower leg: Secondary | ICD-10-CM | POA: Diagnosis not present

## 2023-02-02 DIAGNOSIS — M25561 Pain in right knee: Secondary | ICD-10-CM | POA: Diagnosis not present

## 2023-02-02 DIAGNOSIS — M62551 Muscle wasting and atrophy, not elsewhere classified, right thigh: Secondary | ICD-10-CM | POA: Diagnosis not present

## 2023-02-02 DIAGNOSIS — R2689 Other abnormalities of gait and mobility: Secondary | ICD-10-CM | POA: Diagnosis not present

## 2023-02-05 DIAGNOSIS — M62561 Muscle wasting and atrophy, not elsewhere classified, right lower leg: Secondary | ICD-10-CM | POA: Diagnosis not present

## 2023-02-05 DIAGNOSIS — M25561 Pain in right knee: Secondary | ICD-10-CM | POA: Diagnosis not present

## 2023-02-05 DIAGNOSIS — R2689 Other abnormalities of gait and mobility: Secondary | ICD-10-CM | POA: Diagnosis not present

## 2023-02-05 DIAGNOSIS — M62551 Muscle wasting and atrophy, not elsewhere classified, right thigh: Secondary | ICD-10-CM | POA: Diagnosis not present

## 2023-02-05 DIAGNOSIS — R278 Other lack of coordination: Secondary | ICD-10-CM | POA: Diagnosis not present

## 2023-02-16 DIAGNOSIS — R21 Rash and other nonspecific skin eruption: Secondary | ICD-10-CM | POA: Diagnosis not present

## 2023-02-17 DIAGNOSIS — R21 Rash and other nonspecific skin eruption: Secondary | ICD-10-CM | POA: Diagnosis not present

## 2023-03-07 DIAGNOSIS — R0981 Nasal congestion: Secondary | ICD-10-CM | POA: Diagnosis not present

## 2023-03-22 DIAGNOSIS — M79671 Pain in right foot: Secondary | ICD-10-CM | POA: Diagnosis not present

## 2023-03-22 DIAGNOSIS — M79672 Pain in left foot: Secondary | ICD-10-CM | POA: Diagnosis not present

## 2023-03-22 DIAGNOSIS — B351 Tinea unguium: Secondary | ICD-10-CM | POA: Diagnosis not present

## 2023-04-05 DIAGNOSIS — E559 Vitamin D deficiency, unspecified: Secondary | ICD-10-CM | POA: Diagnosis not present

## 2023-04-05 DIAGNOSIS — N951 Menopausal and female climacteric states: Secondary | ICD-10-CM | POA: Diagnosis not present

## 2023-04-05 DIAGNOSIS — R7989 Other specified abnormal findings of blood chemistry: Secondary | ICD-10-CM | POA: Diagnosis not present

## 2023-04-05 DIAGNOSIS — R5383 Other fatigue: Secondary | ICD-10-CM | POA: Diagnosis not present

## 2023-04-05 DIAGNOSIS — L932 Other local lupus erythematosus: Secondary | ICD-10-CM | POA: Diagnosis not present

## 2023-04-05 DIAGNOSIS — E7849 Other hyperlipidemia: Secondary | ICD-10-CM | POA: Diagnosis not present

## 2023-04-06 DIAGNOSIS — H5203 Hypermetropia, bilateral: Secondary | ICD-10-CM | POA: Diagnosis not present

## 2023-04-06 DIAGNOSIS — H52223 Regular astigmatism, bilateral: Secondary | ICD-10-CM | POA: Diagnosis not present

## 2023-04-06 DIAGNOSIS — H18053 Posterior corneal pigmentations, bilateral: Secondary | ICD-10-CM | POA: Diagnosis not present

## 2023-04-06 DIAGNOSIS — H26491 Other secondary cataract, right eye: Secondary | ICD-10-CM | POA: Diagnosis not present

## 2023-04-06 DIAGNOSIS — H35361 Drusen (degenerative) of macula, right eye: Secondary | ICD-10-CM | POA: Diagnosis not present

## 2023-04-06 DIAGNOSIS — H524 Presbyopia: Secondary | ICD-10-CM | POA: Diagnosis not present

## 2023-04-06 DIAGNOSIS — H40011 Open angle with borderline findings, low risk, right eye: Secondary | ICD-10-CM | POA: Diagnosis not present

## 2023-04-07 LAB — LAB REPORT - SCANNED
A1c: 6
EGFR: 81

## 2023-04-14 ENCOUNTER — Encounter: Payer: Self-pay | Admitting: Hematology & Oncology

## 2023-04-14 ENCOUNTER — Inpatient Hospital Stay: Payer: Medicare PPO | Attending: Hematology & Oncology

## 2023-04-14 ENCOUNTER — Inpatient Hospital Stay: Payer: Medicare PPO | Admitting: Hematology & Oncology

## 2023-04-14 VITALS — BP 128/53 | HR 52 | Temp 97.8°F | Resp 20 | Ht 64.0 in | Wt 122.8 lb

## 2023-04-14 DIAGNOSIS — Z79899 Other long term (current) drug therapy: Secondary | ICD-10-CM | POA: Diagnosis not present

## 2023-04-14 DIAGNOSIS — D472 Monoclonal gammopathy: Secondary | ICD-10-CM | POA: Diagnosis not present

## 2023-04-14 DIAGNOSIS — D509 Iron deficiency anemia, unspecified: Secondary | ICD-10-CM | POA: Diagnosis not present

## 2023-04-14 LAB — CMP (CANCER CENTER ONLY)
ALT: 15 U/L (ref 0–44)
AST: 20 U/L (ref 15–41)
Albumin: 4.3 g/dL (ref 3.5–5.0)
Alkaline Phosphatase: 60 U/L (ref 38–126)
Anion gap: 7 (ref 5–15)
BUN: 22 mg/dL (ref 8–23)
CO2: 30 mmol/L (ref 22–32)
Calcium: 9.5 mg/dL (ref 8.9–10.3)
Chloride: 106 mmol/L (ref 98–111)
Creatinine: 0.74 mg/dL (ref 0.44–1.00)
GFR, Estimated: >60 mL/min (ref >60)
Glucose, Bld: 84 mg/dL (ref 70–99)
Potassium: 3.6 mmol/L (ref 3.5–5.1)
Sodium: 143 mmol/L (ref 135–145)
Total Bilirubin: 0.5 mg/dL (ref 0.0–1.2)
Total Protein: 7.3 g/dL (ref 6.5–8.1)

## 2023-04-14 LAB — IRON AND IRON BINDING CAPACITY (CC-WL,HP ONLY)
Iron: 70 ug/dL (ref 28–170)
Saturation Ratios: 24 % (ref 10.4–31.8)
TIBC: 298 ug/dL (ref 250–450)
UIBC: 228 ug/dL (ref 148–442)

## 2023-04-14 LAB — CBC WITH DIFFERENTIAL (CANCER CENTER ONLY)
Abs Immature Granulocytes: 0.03 10*3/uL (ref 0.00–0.07)
Basophils Absolute: 0 10*3/uL (ref 0.0–0.1)
Basophils Relative: 0 %
Eosinophils Absolute: 0.2 10*3/uL (ref 0.0–0.5)
Eosinophils Relative: 6 %
HCT: 32.6 % — ABNORMAL LOW (ref 36.0–46.0)
Hemoglobin: 10.6 g/dL — ABNORMAL LOW (ref 12.0–15.0)
Immature Granulocytes: 1 %
Lymphocytes Relative: 37 %
Lymphs Abs: 1.4 10*3/uL (ref 0.7–4.0)
MCH: 26.2 pg (ref 26.0–34.0)
MCHC: 32.5 g/dL (ref 30.0–36.0)
MCV: 80.7 fL (ref 80.0–100.0)
Monocytes Absolute: 0.5 10*3/uL (ref 0.1–1.0)
Monocytes Relative: 14 %
Neutro Abs: 1.6 10*3/uL — ABNORMAL LOW (ref 1.7–7.7)
Neutrophils Relative %: 42 %
Platelet Count: 138 10*3/uL — ABNORMAL LOW (ref 150–400)
RBC: 4.04 MIL/uL (ref 3.87–5.11)
RDW: 17.2 % — ABNORMAL HIGH (ref 11.5–15.5)
WBC Count: 3.7 10*3/uL — ABNORMAL LOW (ref 4.0–10.5)
nRBC: 0 % (ref 0.0–0.2)

## 2023-04-14 LAB — RETICULOCYTES
Immature Retic Fract: 12.8 % (ref 2.3–15.9)
RBC.: 4.06 MIL/uL (ref 3.87–5.11)
Retic Count, Absolute: 47.9 10*3/uL (ref 19.0–186.0)
Retic Ct Pct: 1.2 % (ref 0.4–3.1)

## 2023-04-14 LAB — LACTATE DEHYDROGENASE: LDH: 196 U/L — ABNORMAL HIGH (ref 98–192)

## 2023-04-14 LAB — FERRITIN: Ferritin: 252 ng/mL (ref 11–307)

## 2023-04-14 NOTE — Progress Notes (Signed)
 Hematology and Oncology Follow Up Visit  Kristina Brooks [id number] October 28, 1934 89 y.o. 04/14/2023   Principle Diagnosis:  IgG Lambda MGUS Anemia of iron deficiency Anemia of erythropoietin deficiency Alpha thalassemia trait  Current Therapy:   Observation     Interim History:  Kristina Brooks is back for follow-up.  We last saw her back in December.  Since then, she been doing pretty well.  She was last seen and had a monoclonal spike of 0.6 g/dL.  This is holding steady.  Her IgG level was 1365 mg/dL.  Her lambda light chain was 3.6 mg/dL.Marland Kitchen  She taking the Dupixent for her eczema.  She is a little bit worried about a positive ANA that she had in with her lab test that were done by her family doctor.  I think this probably goes along with her having an autoimmune issue.  She does have Raynaud's also.  She is exercising.  She is going to the gym quite often.  She is exercising and really keep herself in great shape.  She has had no change in bowel or bladder habits.  She has had no bleeding.  She has had no fever.  Overall, I would say that her performance status is probably ECOG 1.    Medications:  Current Outpatient Medications:    amLODipine (NORVASC) 2.5 MG tablet, Take 2.5 mg by mouth daily., Disp: , Rfl:    Chromic Chloride CRYS, daily., Disp: , Rfl:    DUPIXENT 300 MG/2ML SOPN, Inject 300 mg into the skin every 14 (fourteen) days., Disp: , Rfl:    EUCRISA 2 % OINT, Apply 1 Application topically 2 (two) times daily., Disp: , Rfl:    fluticasone (FLONASE) 50 MCG/ACT nasal spray, Place into both nostrils daily., Disp: , Rfl:    NON FORMULARY, , Disp: , Rfl:    NON FORMULARY, daily. Carbon 98, Disp: , Rfl:    NON FORMULARY, every 30 (thirty) days. Formula A 16 B, Disp: , Rfl:    NON FORMULARY, 50 mg at bedtime. Progesterone, Disp: , Rfl:    NON FORMULARY, 04/17/2022  Pyridoxal 1,2,3,4- Takes 3 times weekly., Disp: , Rfl:    NON FORMULARY, daily. Lanney Gins [place], Disp: , Rfl:     diphenhydrAMINE HCl (BENADRYL PO), Take 12.5 mg by mouth at bedtime. (Patient not taking: Reported on 04/14/2023), Disp: , Rfl:    fexofenadine (ALLEGRA ALLERGY) 180 MG tablet, daily as needed., Disp: , Rfl:   Allergies:  Allergies  Allergen Reactions   Bioflavonoids Rash   Chocolate Flavoring Agent (Non-Screening) Rash   Penicillin G Other (See Comments)   Penicillins Other (See Comments)    Unknown Reaction   Tomato (Diagnostic) Rash    Past Medical History, Surgical history, Social history, and Family History were reviewed and updated.  Review of Systems: Review of Systems  Constitutional: Negative.   HENT:  Negative.    Eyes: Negative.   Respiratory: Negative.    Cardiovascular: Negative.   Gastrointestinal: Negative.   Endocrine: Negative.   Genitourinary: Negative.    Musculoskeletal: Negative.   Skin: Negative.   Neurological: Negative.   Hematological: Negative.   Psychiatric/Behavioral: Negative.      Physical Exam: Her vital signs are temperature of 97.8.  Pulse 52.  Blood pressure 128/53.  Weight is 122 pounds.     Wt Readings from Last 3 Encounters:  04/14/23 122 lb 12.8 oz (55.7 kg)  01/12/23 121 lb (54.9 kg)  10/14/22 120 lb 6.4 oz (54.6 kg)  Physical Exam Vitals reviewed.  HENT:     Head: Normocephalic and atraumatic.  Eyes:     Pupils: Pupils are equal, round, and reactive to light.  Cardiovascular:     Rate and Rhythm: Normal rate and regular rhythm.     Heart sounds: Normal heart sounds.  Pulmonary:     Effort: Pulmonary effort is normal.     Breath sounds: Normal breath sounds.  Abdominal:     General: Bowel sounds are normal.     Palpations: Abdomen is soft.  Musculoskeletal:        General: No tenderness or deformity. Normal range of motion.     Cervical back: Normal range of motion.     Comments: She has a brace on her right wrist.  Lymphadenopathy:     Cervical: No cervical adenopathy.  Skin:    General: Skin is warm and dry.      Findings: No erythema or rash.  Neurological:     Mental Status: She is alert and oriented to person, place, and time.  Psychiatric:        Behavior: Behavior normal.        Thought Content: Thought content normal.        Judgment: Judgment normal.      Lab Results  Component Value Date   WBC 3.7 (L) 04/14/2023   HGB 10.6 (L) 04/14/2023   HCT 32.6 (L) 04/14/2023   MCV 80.7 04/14/2023   PLT 138 (L) 04/14/2023     Chemistry      Component Value Date/Time   NA 143 01/12/2023 0814   K 3.8 01/12/2023 0814   CL 105 01/12/2023 0814   CO2 30 01/12/2023 0814   BUN 24 (H) 01/12/2023 0814   CREATININE 0.92 01/12/2023 0814      Component Value Date/Time   CALCIUM 9.9 01/12/2023 0814   ALKPHOS 63 01/12/2023 0814   AST 20 01/12/2023 0814   ALT 14 01/12/2023 0814   BILITOT 0.3 01/12/2023 0814     Impression and Plan:  Kristina Brooks is a very nice 89 year old African-American female.  She certainly looks a lot younger.  She is incredibly accomplished.  She has been to [place].  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  We will see what her monoclonal studies show.  I suspect that she will always have a slight elevation of these because of an underlying collagen vascular disorder.  I think that as long as she is exercising, she is doing herself I favor.  This is keeping her bones strong.  We will plan to get her back in another 3 or 4 months.   [name], MD 3/12/20258:40 AM

## 2023-04-15 LAB — KAPPA/LAMBDA LIGHT CHAINS
Kappa free light chain: 29.3 mg/L — ABNORMAL HIGH (ref 3.3–19.4)
Kappa, lambda light chain ratio: 0.76 (ref 0.26–1.65)
Lambda free light chains: 38.4 mg/L — ABNORMAL HIGH (ref 5.7–26.3)

## 2023-04-15 LAB — IGG, IGA, IGM
IgA: 151 mg/dL (ref 64–422)
IgG (Immunoglobin G), Serum: 1386 mg/dL (ref 586–1602)
IgM (Immunoglobulin M), Srm: 46 mg/dL (ref 26–217)

## 2023-04-19 DIAGNOSIS — R21 Rash and other nonspecific skin eruption: Secondary | ICD-10-CM | POA: Diagnosis not present

## 2023-04-19 LAB — PROTEIN ELECTROPHORESIS, SERUM, WITH REFLEX
A/G Ratio: 1.3 (ref 0.7–1.7)
Albumin ELP: 3.9 g/dL (ref 2.9–4.4)
Alpha-1-Globulin: 0.3 g/dL (ref 0.0–0.4)
Alpha-2-Globulin: 0.8 g/dL (ref 0.4–1.0)
Beta Globulin: 0.9 g/dL (ref 0.7–1.3)
Gamma Globulin: 1.1 g/dL (ref 0.4–1.8)
Globulin, Total: 3.1 g/dL (ref 2.2–3.9)
M-Spike, %: 0.5 g/dL — ABNORMAL HIGH
SPEP Interpretation: 0
Total Protein ELP: 7 g/dL (ref 6.0–8.5)

## 2023-04-19 LAB — IMMUNOFIXATION REFLEX, SERUM
IgA: 156 mg/dL (ref 64–422)
IgG (Immunoglobin G), Serum: 1436 mg/dL (ref 586–1602)
IgM (Immunoglobulin M), Srm: 47 mg/dL (ref 26–217)

## 2023-04-20 ENCOUNTER — Encounter: Payer: Self-pay | Admitting: *Deleted

## 2023-05-17 DIAGNOSIS — R269 Unspecified abnormalities of gait and mobility: Secondary | ICD-10-CM | POA: Diagnosis not present

## 2023-05-17 DIAGNOSIS — M8580 Other specified disorders of bone density and structure, unspecified site: Secondary | ICD-10-CM | POA: Diagnosis not present

## 2023-05-17 DIAGNOSIS — I129 Hypertensive chronic kidney disease with stage 1 through stage 4 chronic kidney disease, or unspecified chronic kidney disease: Secondary | ICD-10-CM | POA: Diagnosis not present

## 2023-05-17 DIAGNOSIS — N182 Chronic kidney disease, stage 2 (mild): Secondary | ICD-10-CM | POA: Diagnosis not present

## 2023-05-17 DIAGNOSIS — Z8249 Family history of ischemic heart disease and other diseases of the circulatory system: Secondary | ICD-10-CM | POA: Diagnosis not present

## 2023-05-17 DIAGNOSIS — J301 Allergic rhinitis due to pollen: Secondary | ICD-10-CM | POA: Diagnosis not present

## 2023-05-17 DIAGNOSIS — Z823 Family history of stroke: Secondary | ICD-10-CM | POA: Diagnosis not present

## 2023-05-17 DIAGNOSIS — H3530 Unspecified macular degeneration: Secondary | ICD-10-CM | POA: Diagnosis not present

## 2023-05-17 DIAGNOSIS — E785 Hyperlipidemia, unspecified: Secondary | ICD-10-CM | POA: Diagnosis not present

## 2023-05-24 DIAGNOSIS — M79671 Pain in right foot: Secondary | ICD-10-CM | POA: Diagnosis not present

## 2023-05-24 DIAGNOSIS — B351 Tinea unguium: Secondary | ICD-10-CM | POA: Diagnosis not present

## 2023-05-24 DIAGNOSIS — M79672 Pain in left foot: Secondary | ICD-10-CM | POA: Diagnosis not present

## 2023-06-22 DIAGNOSIS — L2089 Other atopic dermatitis: Secondary | ICD-10-CM | POA: Diagnosis not present

## 2023-06-22 DIAGNOSIS — I872 Venous insufficiency (chronic) (peripheral): Secondary | ICD-10-CM | POA: Diagnosis not present

## 2023-06-22 DIAGNOSIS — L821 Other seborrheic keratosis: Secondary | ICD-10-CM | POA: Diagnosis not present

## 2023-07-20 ENCOUNTER — Encounter: Payer: Self-pay | Admitting: Hematology & Oncology

## 2023-07-20 ENCOUNTER — Inpatient Hospital Stay: Admitting: Hematology & Oncology

## 2023-07-20 ENCOUNTER — Inpatient Hospital Stay: Attending: Hematology & Oncology

## 2023-07-20 ENCOUNTER — Other Ambulatory Visit: Payer: Self-pay

## 2023-07-20 VITALS — BP 131/56 | HR 51 | Temp 98.1°F | Resp 16

## 2023-07-20 DIAGNOSIS — D472 Monoclonal gammopathy: Secondary | ICD-10-CM

## 2023-07-20 DIAGNOSIS — L209 Atopic dermatitis, unspecified: Secondary | ICD-10-CM | POA: Insufficient documentation

## 2023-07-20 DIAGNOSIS — D563 Thalassemia minor: Secondary | ICD-10-CM | POA: Insufficient documentation

## 2023-07-20 DIAGNOSIS — D509 Iron deficiency anemia, unspecified: Secondary | ICD-10-CM | POA: Insufficient documentation

## 2023-07-20 LAB — CBC WITH DIFFERENTIAL (CANCER CENTER ONLY)
Abs Immature Granulocytes: 0.03 10*3/uL (ref 0.00–0.07)
Basophils Absolute: 0 10*3/uL (ref 0.0–0.1)
Basophils Relative: 1 %
Eosinophils Absolute: 0.3 10*3/uL (ref 0.0–0.5)
Eosinophils Relative: 8 %
HCT: 33.5 % — ABNORMAL LOW (ref 36.0–46.0)
Hemoglobin: 10.9 g/dL — ABNORMAL LOW (ref 12.0–15.0)
Immature Granulocytes: 1 %
Lymphocytes Relative: 36 %
Lymphs Abs: 1.4 10*3/uL (ref 0.7–4.0)
MCH: 26.3 pg (ref 26.0–34.0)
MCHC: 32.5 g/dL (ref 30.0–36.0)
MCV: 80.7 fL (ref 80.0–100.0)
Monocytes Absolute: 0.6 10*3/uL (ref 0.1–1.0)
Monocytes Relative: 16 %
Neutro Abs: 1.5 10*3/uL — ABNORMAL LOW (ref 1.7–7.7)
Neutrophils Relative %: 38 %
Platelet Count: 131 10*3/uL — ABNORMAL LOW (ref 150–400)
RBC: 4.15 MIL/uL (ref 3.87–5.11)
RDW: 16.8 % — ABNORMAL HIGH (ref 11.5–15.5)
WBC Count: 3.8 10*3/uL — ABNORMAL LOW (ref 4.0–10.5)
nRBC: 0 % (ref 0.0–0.2)

## 2023-07-20 LAB — CMP (CANCER CENTER ONLY)
ALT: 16 U/L (ref 0–44)
AST: 20 U/L (ref 15–41)
Albumin: 4.3 g/dL (ref 3.5–5.0)
Alkaline Phosphatase: 63 U/L (ref 38–126)
Anion gap: 7 (ref 5–15)
BUN: 24 mg/dL — ABNORMAL HIGH (ref 8–23)
CO2: 31 mmol/L (ref 22–32)
Calcium: 9.8 mg/dL (ref 8.9–10.3)
Chloride: 106 mmol/L (ref 98–111)
Creatinine: 0.79 mg/dL (ref 0.44–1.00)
GFR, Estimated: >60 mL/min (ref >60)
Glucose, Bld: 87 mg/dL (ref 70–99)
Potassium: 3.8 mmol/L (ref 3.5–5.1)
Sodium: 144 mmol/L (ref 135–145)
Total Bilirubin: 0.4 mg/dL (ref 0.0–1.2)
Total Protein: 7.1 g/dL (ref 6.5–8.1)

## 2023-07-20 LAB — LACTATE DEHYDROGENASE: LDH: 210 U/L — ABNORMAL HIGH (ref 98–192)

## 2023-07-20 NOTE — Progress Notes (Signed)
 Hematology and Oncology Follow Up Visit  Kristina Brooks [id number] [birth year] 89 y.o. 07/20/2023   Principle Diagnosis:  IgG Lambda MGUS Anemia of iron deficiency Anemia of erythropoietin  deficiency Alpha thalassemia trait  Current Therapy:   Observation     Interim History:  Kristina Brooks is back for follow-up.  We saw her back in March.  Since then, she has been doing pretty well.  She has seen the dermatologist.  She continues on the Dupixent for the atopic dermatitis.  We last saw her back in March, monoclonal spike was 0.5 g/dL.  Her IgG level was 1400 mg/dL.  The lambda light chain was 2.9 mg/dL.  She has had no probably fever.  She has had no issues with nausea or vomiting.  She has had no change in bowel or bladder habits..  She is exercising.  She enjoys her exercise.  She is learning Spanish as a second language.  I am sure this is going to help her overall cognitive state.  She has had no bleeding.  There has been no headache.  Overall, I would say that her performance status is probably ECOG 1.      Medications:  Current Outpatient Medications:    amLODipine (NORVASC) 2.5 MG tablet, Take 2.5 mg by mouth daily., Disp: , Rfl:    Chromic Chloride CRYS, daily., Disp: , Rfl:    diphenhydrAMINE HCl (BENADRYL PO), Take 12.5 mg by mouth at bedtime. (Patient not taking: Reported on 04/14/2023), Disp: , Rfl:    DUPIXENT 300 MG/2ML SOPN, Inject 300 mg into the skin every 14 (fourteen) days., Disp: , Rfl:    EUCRISA 2 % OINT, Apply 1 Application topically 2 (two) times daily., Disp: , Rfl:    fexofenadine (ALLEGRA ALLERGY) 180 MG tablet, daily as needed., Disp: , Rfl:    fluticasone (FLONASE) 50 MCG/ACT nasal spray, Place into both nostrils daily., Disp: , Rfl:    NON FORMULARY, , Disp: , Rfl:    NON FORMULARY, daily. Carbon 98, Disp: , Rfl:    NON FORMULARY, every 30 (thirty) days. Formula A 16 B, Disp: , Rfl:    NON FORMULARY, 50 mg at bedtime. Progesterone, Disp: , Rfl:     NON FORMULARY, 04/17/2022  Pyridoxal 1,2,3,4- Takes 3 times weekly., Disp: , Rfl:    NON FORMULARY, daily. Delsie Figures [place], Disp: , Rfl:   Allergies:  Allergies  Allergen Reactions   Bioflavonoids Rash   Chocolate Flavoring Agent (Non-Screening) Rash   Penicillin G Other (See Comments)   Penicillins Other (See Comments)    Unknown Reaction   Tomato (Diagnostic) Rash    Past Medical History, Surgical history, Social history, and Family History were reviewed and updated.  Review of Systems: Review of Systems  Constitutional: Negative.   HENT:  Negative.    Eyes: Negative.   Respiratory: Negative.    Cardiovascular: Negative.   Gastrointestinal: Negative.   Endocrine: Negative.   Genitourinary: Negative.    Musculoskeletal: Negative.   Skin: Negative.   Neurological: Negative.   Hematological: Negative.   Psychiatric/Behavioral: Negative.      Physical Exam: Her vital signs are temperature of 98.1.  Pulse 51.  Blood pressure 131/56.  Weight is 121 pounds.    Wt Readings from Last 3 Encounters:  04/14/23 122 lb 12.8 oz (55.7 kg)  01/12/23 121 lb (54.9 kg)  10/14/22 120 lb 6.4 oz (54.6 kg)    Physical Exam Vitals reviewed.  HENT:     Head: Normocephalic and atraumatic.  Eyes:     Pupils: Pupils are equal, round, and reactive to light.    Cardiovascular:     Rate and Rhythm: Normal rate and regular rhythm.     Heart sounds: Normal heart sounds.  Pulmonary:     Effort: Pulmonary effort is normal.     Breath sounds: Normal breath sounds.  Abdominal:     General: Bowel sounds are normal.     Palpations: Abdomen is soft.   Musculoskeletal:        General: No tenderness or deformity. Normal range of motion.     Cervical back: Normal range of motion.     Comments: She has a brace on her right wrist.  Lymphadenopathy:     Cervical: No cervical adenopathy.   Skin:    General: Skin is warm and dry.     Findings: No erythema or rash.   Neurological:     Mental  Status: She is alert and oriented to person, place, and time.   Psychiatric:        Behavior: Behavior normal.        Thought Content: Thought content normal.        Judgment: Judgment normal.      Lab Results  Component Value Date   WBC 3.8 (L) 07/20/2023   HGB 10.9 (L) 07/20/2023   HCT 33.5 (L) 07/20/2023   MCV 80.7 07/20/2023   PLT 131 (L) 07/20/2023     Chemistry      Component Value Date/Time   NA 143 04/14/2023 0804   K 3.6 04/14/2023 0804   CL 106 04/14/2023 0804   CO2 30 04/14/2023 0804   BUN 22 04/14/2023 0804   CREATININE 0.74 04/14/2023 0804      Component Value Date/Time   CALCIUM 9.5 04/14/2023 0804   ALKPHOS 60 04/14/2023 0804   AST 20 04/14/2023 0804   ALT 15 04/14/2023 0804   BILITOT 0.5 04/14/2023 0804     Impression and Plan:  Kristina Brooks is a very nice 89 year old African-American female.  She certainly looks a lot younger.  She is incredibly accomplished.  She has been to [place].  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  I had to believe that her monoclonal studies began to be okay.  Still think that she has an underlying collagen vascular issue.  I think we can probably get her through the Summer now.  I would like to get her back after Labor Day.   Ivor Mars, MD 6/17/20258:51 AM

## 2023-07-21 LAB — IGG, IGA, IGM
IgA: 137 mg/dL (ref 64–422)
IgG (Immunoglobin G), Serum: 1314 mg/dL (ref 586–1602)
IgM (Immunoglobulin M), Srm: 50 mg/dL (ref 26–217)

## 2023-07-21 LAB — KAPPA/LAMBDA LIGHT CHAINS
Kappa free light chain: 27.1 mg/L — ABNORMAL HIGH (ref 3.3–19.4)
Kappa, lambda light chain ratio: 0.71 (ref 0.26–1.65)
Lambda free light chains: 38.2 mg/L — ABNORMAL HIGH (ref 5.7–26.3)

## 2023-07-23 LAB — PROTEIN ELECTROPHORESIS, SERUM, WITH REFLEX
A/G Ratio: 1.1 (ref 0.7–1.7)
Albumin ELP: 3.6 g/dL (ref 2.9–4.4)
Alpha-1-Globulin: 0.2 g/dL (ref 0.0–0.4)
Alpha-2-Globulin: 0.8 g/dL (ref 0.4–1.0)
Beta Globulin: 1 g/dL (ref 0.7–1.3)
Gamma Globulin: 1.2 g/dL (ref 0.4–1.8)
Globulin, Total: 3.2 g/dL (ref 2.2–3.9)
M-Spike, %: 0.6 g/dL — ABNORMAL HIGH
SPEP Interpretation: 0
Total Protein ELP: 6.8 g/dL (ref 6.0–8.5)

## 2023-07-23 LAB — IMMUNOFIXATION REFLEX, SERUM
IgA: 152 mg/dL (ref 64–422)
IgG (Immunoglobin G), Serum: 1425 mg/dL (ref 586–1602)
IgM (Immunoglobulin M), Srm: 50 mg/dL (ref 26–217)

## 2023-07-24 ENCOUNTER — Ambulatory Visit: Payer: Self-pay | Admitting: Hematology & Oncology

## 2023-07-26 NOTE — Telephone Encounter (Signed)
-----   Message from Maude JONELLE Crease sent at 07/24/2023  7:32 AM EDT ----- Call and let her know that the protein levels are still nice and low and stable.  Great job.  Jeralyn ----- Message ----- From: Rebecka, [facility] [place] Sent: 07/20/2023   8:27 AM EDT To: Maude JONELLE Crease, MD

## 2023-08-02 DIAGNOSIS — L84 Corns and callosities: Secondary | ICD-10-CM | POA: Diagnosis not present

## 2023-08-02 DIAGNOSIS — B351 Tinea unguium: Secondary | ICD-10-CM | POA: Diagnosis not present

## 2023-08-02 DIAGNOSIS — M79672 Pain in left foot: Secondary | ICD-10-CM | POA: Diagnosis not present

## 2023-08-02 DIAGNOSIS — M79671 Pain in right foot: Secondary | ICD-10-CM | POA: Diagnosis not present

## 2023-08-09 DIAGNOSIS — N951 Menopausal and female climacteric states: Secondary | ICD-10-CM | POA: Diagnosis not present

## 2023-08-09 DIAGNOSIS — E7849 Other hyperlipidemia: Secondary | ICD-10-CM | POA: Diagnosis not present

## 2023-08-09 DIAGNOSIS — L932 Other local lupus erythematosus: Secondary | ICD-10-CM | POA: Diagnosis not present

## 2023-08-09 DIAGNOSIS — R7989 Other specified abnormal findings of blood chemistry: Secondary | ICD-10-CM | POA: Diagnosis not present

## 2023-08-09 DIAGNOSIS — R5383 Other fatigue: Secondary | ICD-10-CM | POA: Diagnosis not present

## 2023-08-19 DIAGNOSIS — E7800 Pure hypercholesterolemia, unspecified: Secondary | ICD-10-CM | POA: Diagnosis not present

## 2023-08-19 DIAGNOSIS — L821 Other seborrheic keratosis: Secondary | ICD-10-CM | POA: Diagnosis not present

## 2023-08-19 DIAGNOSIS — Z0000 Encounter for general adult medical examination without abnormal findings: Secondary | ICD-10-CM | POA: Diagnosis not present

## 2023-08-19 DIAGNOSIS — M8580 Other specified disorders of bone density and structure, unspecified site: Secondary | ICD-10-CM | POA: Diagnosis not present

## 2023-08-19 DIAGNOSIS — D61818 Other pancytopenia: Secondary | ICD-10-CM | POA: Diagnosis not present

## 2023-08-19 DIAGNOSIS — I10 Essential (primary) hypertension: Secondary | ICD-10-CM | POA: Diagnosis not present

## 2023-08-19 DIAGNOSIS — L309 Dermatitis, unspecified: Secondary | ICD-10-CM | POA: Diagnosis not present

## 2023-10-07 DIAGNOSIS — M79672 Pain in left foot: Secondary | ICD-10-CM | POA: Diagnosis not present

## 2023-10-07 DIAGNOSIS — M79671 Pain in right foot: Secondary | ICD-10-CM | POA: Diagnosis not present

## 2023-10-07 DIAGNOSIS — B351 Tinea unguium: Secondary | ICD-10-CM | POA: Diagnosis not present

## 2023-10-07 DIAGNOSIS — L84 Corns and callosities: Secondary | ICD-10-CM | POA: Diagnosis not present

## 2023-10-27 DIAGNOSIS — J3089 Other allergic rhinitis: Secondary | ICD-10-CM | POA: Diagnosis not present

## 2023-10-27 DIAGNOSIS — L2089 Other atopic dermatitis: Secondary | ICD-10-CM | POA: Diagnosis not present

## 2023-10-27 DIAGNOSIS — J3081 Allergic rhinitis due to animal (cat) (dog) hair and dander: Secondary | ICD-10-CM | POA: Diagnosis not present

## 2023-10-27 DIAGNOSIS — J301 Allergic rhinitis due to pollen: Secondary | ICD-10-CM | POA: Diagnosis not present

## 2023-11-02 ENCOUNTER — Inpatient Hospital Stay: Attending: Hematology & Oncology

## 2023-11-02 ENCOUNTER — Inpatient Hospital Stay: Admitting: Hematology & Oncology

## 2023-11-02 ENCOUNTER — Other Ambulatory Visit: Payer: Self-pay

## 2023-11-02 VITALS — BP 164/48 | HR 46 | Temp 97.5°F | Resp 16 | Ht 64.0 in | Wt 119.0 lb

## 2023-11-02 DIAGNOSIS — Z79890 Hormone replacement therapy (postmenopausal): Secondary | ICD-10-CM | POA: Insufficient documentation

## 2023-11-02 DIAGNOSIS — D472 Monoclonal gammopathy: Secondary | ICD-10-CM | POA: Insufficient documentation

## 2023-11-02 DIAGNOSIS — D563 Thalassemia minor: Secondary | ICD-10-CM | POA: Insufficient documentation

## 2023-11-02 DIAGNOSIS — Z79899 Other long term (current) drug therapy: Secondary | ICD-10-CM | POA: Diagnosis not present

## 2023-11-02 DIAGNOSIS — Z880 Allergy status to penicillin: Secondary | ICD-10-CM | POA: Insufficient documentation

## 2023-11-02 DIAGNOSIS — D509 Iron deficiency anemia, unspecified: Secondary | ICD-10-CM | POA: Insufficient documentation

## 2023-11-02 LAB — CBC WITH DIFFERENTIAL (CANCER CENTER ONLY)
Abs Immature Granulocytes: 0.02 K/uL (ref 0.00–0.07)
Basophils Absolute: 0 K/uL (ref 0.0–0.1)
Basophils Relative: 1 %
Eosinophils Absolute: 0.2 K/uL (ref 0.0–0.5)
Eosinophils Relative: 5 %
HCT: 34.2 % — ABNORMAL LOW (ref 36.0–46.0)
Hemoglobin: 11.3 g/dL — ABNORMAL LOW (ref 12.0–15.0)
Immature Granulocytes: 1 %
Lymphocytes Relative: 38 %
Lymphs Abs: 1.6 K/uL (ref 0.7–4.0)
MCH: 26.4 pg (ref 26.0–34.0)
MCHC: 33 g/dL (ref 30.0–36.0)
MCV: 79.9 fL — ABNORMAL LOW (ref 80.0–100.0)
Monocytes Absolute: 0.7 K/uL (ref 0.1–1.0)
Monocytes Relative: 17 %
Neutro Abs: 1.5 K/uL — ABNORMAL LOW (ref 1.7–7.7)
Neutrophils Relative %: 38 %
Platelet Count: 138 K/uL — ABNORMAL LOW (ref 150–400)
RBC: 4.28 MIL/uL (ref 3.87–5.11)
RDW: 17.6 % — ABNORMAL HIGH (ref 11.5–15.5)
WBC Count: 4 K/uL (ref 4.0–10.5)
nRBC: 0 % (ref 0.0–0.2)

## 2023-11-02 LAB — CMP (CANCER CENTER ONLY)
ALT: 26 U/L (ref 0–44)
AST: 29 U/L (ref 15–41)
Albumin: 4.4 g/dL (ref 3.5–5.0)
Alkaline Phosphatase: 66 U/L (ref 38–126)
Anion gap: 10 (ref 5–15)
BUN: 18 mg/dL (ref 8–23)
CO2: 28 mmol/L (ref 22–32)
Calcium: 9.8 mg/dL (ref 8.9–10.3)
Chloride: 103 mmol/L (ref 98–111)
Creatinine: 0.75 mg/dL (ref 0.44–1.00)
GFR, Estimated: >60 mL/min (ref >60)
Glucose, Bld: 86 mg/dL (ref 70–99)
Potassium: 4.2 mmol/L (ref 3.5–5.1)
Sodium: 141 mmol/L (ref 135–145)
Total Bilirubin: 0.5 mg/dL (ref 0.0–1.2)
Total Protein: 7.7 g/dL (ref 6.5–8.1)

## 2023-11-02 LAB — RETICULOCYTES
Immature Retic Fract: 12 % (ref 2.3–15.9)
RBC.: 4.27 MIL/uL (ref 3.87–5.11)
Retic Count, Absolute: 35.4 K/uL (ref 19.0–186.0)
Retic Ct Pct: 0.8 % (ref 0.4–3.1)

## 2023-11-02 LAB — LACTATE DEHYDROGENASE: LDH: 236 U/L — ABNORMAL HIGH (ref 98–192)

## 2023-11-02 NOTE — Progress Notes (Signed)
 Hematology and Oncology Follow Up Visit  Kristina Brooks [id number] December 29, 1934 89 y.o. 11/02/2023   Principle Diagnosis:  IgG Lambda MGUS Anemia of iron deficiency Anemia of erythropoietin  deficiency Alpha thalassemia trait  Current Therapy:   Observation     Interim History:  [name] is back for follow-up.  We last saw her back in June.  She has had a nice summer.  She is exercising.  I am very impressed by this.  She enjoys exercising.  Her eczema is getting better.  She is on Dupixent for this..  She does see dermatology.  They are very thin thorough with trying to help with the atopic dermatitis.  She has had no fever.  She has had no nausea or vomiting.  There has been no change in bowel or bladder habits.  She has had no bleeding.  When we last saw her, her monoclonal spike was 0.6 g/dL.  Her IgG level was 1425 mg/dL.  The lambda light chain was 3.8 mg/dL.  Currently, I would have to say that her performance status is probably ECOG 1.   Medications:  Current Outpatient Medications:    amLODipine (NORVASC) 2.5 MG tablet, Take 2.5 mg by mouth daily., Disp: , Rfl:    Chromic Chloride CRYS, daily., Disp: , Rfl:    diphenhydrAMINE HCl (BENADRYL PO), Take 12.5 mg by mouth at bedtime. (Patient not taking: Reported on 04/14/2023), Disp: , Rfl:    DUPIXENT 300 MG/2ML SOPN, Inject 300 mg into the skin every 14 (fourteen) days., Disp: , Rfl:    EUCRISA 2 % OINT, Apply 1 Application topically 2 (two) times daily., Disp: , Rfl:    fexofenadine (ALLEGRA ALLERGY) 180 MG tablet, daily as needed., Disp: , Rfl:    fluticasone (FLONASE) 50 MCG/ACT nasal spray, Place into both nostrils daily., Disp: , Rfl:    NON FORMULARY, , Disp: , Rfl:    NON FORMULARY, daily. Carbon 98, Disp: , Rfl:    NON FORMULARY, every 30 (thirty) days. Formula A 16 B, Disp: , Rfl:    NON FORMULARY, 50 mg at bedtime. Progesterone, Disp: , Rfl:    NON FORMULARY, 04/17/2022  Pyridoxal 1,2,3,4- Takes 3 times  weekly., Disp: , Rfl:    NON FORMULARY, daily. Alena bellingham [place], Disp: , Rfl:   Allergies:  Allergies  Allergen Reactions   Bioflavonoids Rash   Chocolate Flavoring Agent (Non-Screening) Rash   Penicillin G Other (See Comments)   Penicillins Other (See Comments)    Unknown Reaction   Tomato (Diagnostic) Rash    Past Medical History, Surgical history, Social history, and Family History were reviewed and updated.  Review of Systems: Review of Systems  Constitutional: Negative.   HENT:  Negative.    Eyes: Negative.   Respiratory: Negative.    Cardiovascular: Negative.   Gastrointestinal: Negative.   Endocrine: Negative.   Genitourinary: Negative.    Musculoskeletal: Negative.   Skin: Negative.   Neurological: Negative.   Hematological: Negative.   Psychiatric/Behavioral: Negative.      Physical Exam: Her vital signs are temperature of 97.5.  Pulse 46.  Blood pressure 164/48.  Weight is 119 pounds.   Wt Readings from Last 3 Encounters:  04/14/23 122 lb 12.8 oz (55.7 kg)  01/12/23 121 lb (54.9 kg)  10/14/22 120 lb 6.4 oz (54.6 kg)    Physical Exam Vitals reviewed.  HENT:     Head: Normocephalic and atraumatic.  Eyes:     Pupils: Pupils are equal, round, and reactive  to light.  Cardiovascular:     Rate and Rhythm: Normal rate and regular rhythm.     Heart sounds: Normal heart sounds.  Pulmonary:     Effort: Pulmonary effort is normal.     Breath sounds: Normal breath sounds.  Abdominal:     General: Bowel sounds are normal.     Palpations: Abdomen is soft.  Musculoskeletal:        General: No tenderness or deformity. Normal range of motion.     Cervical back: Normal range of motion.  Lymphadenopathy:     Cervical: No cervical adenopathy.  Skin:    General: Skin is warm and dry.     Findings: No erythema or rash.     Comments: she does have areas of scaliness.  She has couple areas of hyperpigmentation.  There is no areas of open skin.  Neurological:     Mental  Status: She is alert and oriented to person, place, and time.  Psychiatric:        Behavior: Behavior normal.        Thought Content: Thought content normal.        Judgment: Judgment normal.      Lab Results  Component Value Date   WBC 4.0 11/02/2023   HGB 11.3 (L) 11/02/2023   HCT 34.2 (L) 11/02/2023   MCV 79.9 (L) 11/02/2023   PLT PENDING 11/02/2023     Chemistry      Component Value Date/Time   NA 144 07/20/2023 0810   K 3.8 07/20/2023 0810   CL 106 07/20/2023 0810   CO2 31 07/20/2023 0810   BUN 24 (H) 07/20/2023 0810   CREATININE 0.79 07/20/2023 0810      Component Value Date/Time   CALCIUM 9.8 07/20/2023 0810   ALKPHOS 63 07/20/2023 0810   AST 20 07/20/2023 0810   ALT 16 07/20/2023 0810   BILITOT 0.4 07/20/2023 0810     Impression and Plan:  Ms. Kristina Brooks is a very nice 88 year old African-American female.  She certainly looks a lot younger.  She is incredibly accomplished.  She has been to [place].  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  I am so happy for her.  I am very impressed by her motivation.  She really does not look her age.  We will plan for another follow-up in about 4 months or so.  We will get her through the Holiday season..  I just do not think that this monoclonal spike will ever be a problem for her.  I think this is all reactionary.   Maude JONELLE Crease, MD 9/30/20258:46 AM

## 2023-11-03 LAB — IGG, IGA, IGM
IgA: 149 mg/dL (ref 64–422)
IgG (Immunoglobin G), Serum: 1486 mg/dL (ref 586–1602)
IgM (Immunoglobulin M), Srm: 55 mg/dL (ref 26–217)

## 2023-11-03 LAB — KAPPA/LAMBDA LIGHT CHAINS
Kappa free light chain: 27.4 mg/L — ABNORMAL HIGH (ref 3.3–19.4)
Kappa, lambda light chain ratio: 0.69 (ref 0.26–1.65)
Lambda free light chains: 39.6 mg/L — ABNORMAL HIGH (ref 5.7–26.3)

## 2023-11-05 ENCOUNTER — Ambulatory Visit: Payer: Self-pay | Admitting: Hematology & Oncology

## 2023-11-05 LAB — PROTEIN ELECTROPHORESIS, SERUM, WITH REFLEX
A/G Ratio: 1.1 (ref 0.7–1.7)
Albumin ELP: 3.6 g/dL (ref 2.9–4.4)
Alpha-1-Globulin: 0.3 g/dL (ref 0.0–0.4)
Alpha-2-Globulin: 0.9 g/dL (ref 0.4–1.0)
Beta Globulin: 1 g/dL (ref 0.7–1.3)
Gamma Globulin: 1.3 g/dL (ref 0.4–1.8)
Globulin, Total: 3.4 g/dL (ref 2.2–3.9)
M-Spike, %: 0.6 g/dL — ABNORMAL HIGH
SPEP Interpretation: 0
Total Protein ELP: 7 g/dL (ref 6.0–8.5)

## 2023-11-05 LAB — IMMUNOFIXATION REFLEX, SERUM
IgA: 145 mg/dL (ref 64–422)
IgG (Immunoglobin G), Serum: 1452 mg/dL (ref 586–1602)
IgM (Immunoglobulin M), Srm: 56 mg/dL (ref 26–217)

## 2023-11-10 DIAGNOSIS — N951 Menopausal and female climacteric states: Secondary | ICD-10-CM | POA: Diagnosis not present

## 2023-11-22 DIAGNOSIS — Z01419 Encounter for gynecological examination (general) (routine) without abnormal findings: Secondary | ICD-10-CM | POA: Diagnosis not present

## 2023-11-22 DIAGNOSIS — Z780 Asymptomatic menopausal state: Secondary | ICD-10-CM | POA: Diagnosis not present

## 2023-11-22 DIAGNOSIS — Z1231 Encounter for screening mammogram for malignant neoplasm of breast: Secondary | ICD-10-CM | POA: Diagnosis not present

## 2023-11-22 DIAGNOSIS — N905 Atrophy of vulva: Secondary | ICD-10-CM | POA: Diagnosis not present

## 2023-11-29 DIAGNOSIS — L932 Other local lupus erythematosus: Secondary | ICD-10-CM | POA: Diagnosis not present

## 2023-11-29 DIAGNOSIS — E7849 Other hyperlipidemia: Secondary | ICD-10-CM | POA: Diagnosis not present

## 2023-11-29 DIAGNOSIS — R5383 Other fatigue: Secondary | ICD-10-CM | POA: Diagnosis not present

## 2023-11-29 DIAGNOSIS — N951 Menopausal and female climacteric states: Secondary | ICD-10-CM | POA: Diagnosis not present

## 2023-12-06 DIAGNOSIS — N951 Menopausal and female climacteric states: Secondary | ICD-10-CM | POA: Diagnosis not present

## 2023-12-09 DIAGNOSIS — M79671 Pain in right foot: Secondary | ICD-10-CM | POA: Diagnosis not present

## 2023-12-09 DIAGNOSIS — M79672 Pain in left foot: Secondary | ICD-10-CM | POA: Diagnosis not present

## 2023-12-09 DIAGNOSIS — B351 Tinea unguium: Secondary | ICD-10-CM | POA: Diagnosis not present

## 2023-12-09 DIAGNOSIS — L84 Corns and callosities: Secondary | ICD-10-CM | POA: Diagnosis not present

## 2024-03-07 ENCOUNTER — Ambulatory Visit: Admitting: Hematology & Oncology

## 2024-03-07 ENCOUNTER — Inpatient Hospital Stay

## 2024-03-15 ENCOUNTER — Inpatient Hospital Stay

## 2024-03-15 ENCOUNTER — Inpatient Hospital Stay: Admitting: Hematology & Oncology
